# Patient Record
Sex: Female | Born: 1945 | Race: Black or African American | Hispanic: No | Smoking: Never smoker
Health system: Southern US, Community
[De-identification: ages and names within clinical notes are randomized; demographics above are authoritative.]

## PROBLEM LIST (undated history)

## (undated) DIAGNOSIS — E785 Hyperlipidemia, unspecified: Secondary | ICD-10-CM

## (undated) DIAGNOSIS — C73 Malignant neoplasm of thyroid gland: Secondary | ICD-10-CM

## (undated) DIAGNOSIS — I10 Essential (primary) hypertension: Secondary | ICD-10-CM

## (undated) DIAGNOSIS — M1990 Unspecified osteoarthritis, unspecified site: Secondary | ICD-10-CM

## (undated) HISTORY — DX: Essential (primary) hypertension: I10

## (undated) HISTORY — DX: Unspecified osteoarthritis, unspecified site: M19.90

## (undated) HISTORY — DX: Malignant neoplasm of thyroid gland: C73

## (undated) HISTORY — PX: TUBAL LIGATION: SHX77

## (undated) HISTORY — DX: Hyperlipidemia, unspecified: E78.5

---

## 2002-07-24 ENCOUNTER — Encounter: Payer: Self-pay | Admitting: Internal Medicine

## 2005-07-16 LAB — CONVERTED CEMR LAB: Pap Smear: NORMAL

## 2005-11-12 ENCOUNTER — Encounter: Admission: RE | Admit: 2005-11-12 | Discharge: 2005-11-12 | Payer: Self-pay | Admitting: Family Medicine

## 2006-03-25 ENCOUNTER — Ambulatory Visit: Payer: Self-pay | Admitting: Family Medicine

## 2007-03-25 ENCOUNTER — Telehealth (INDEPENDENT_AMBULATORY_CARE_PROVIDER_SITE_OTHER): Payer: Self-pay | Admitting: *Deleted

## 2007-03-28 ENCOUNTER — Ambulatory Visit: Payer: Self-pay | Admitting: Family Medicine

## 2007-03-28 ENCOUNTER — Telehealth (INDEPENDENT_AMBULATORY_CARE_PROVIDER_SITE_OTHER): Payer: Self-pay | Admitting: *Deleted

## 2007-03-28 DIAGNOSIS — I10 Essential (primary) hypertension: Secondary | ICD-10-CM

## 2007-03-28 DIAGNOSIS — J309 Allergic rhinitis, unspecified: Secondary | ICD-10-CM | POA: Insufficient documentation

## 2007-03-28 LAB — CONVERTED CEMR LAB
BUN: 13 mg/dL (ref 6–23)
CO2: 33 meq/L — ABNORMAL HIGH (ref 19–32)
Calcium: 9.2 mg/dL (ref 8.4–10.5)
Cholesterol: 256 mg/dL (ref 0–200)
GFR calc Af Amer: 82 mL/min
Glucose, Urine, Semiquant: NEGATIVE
HDL: 77.5 mg/dL (ref >39.0)
Ketones, urine, test strip: NEGATIVE
Nitrite: NEGATIVE
Potassium: 2.9 meq/L — ABNORMAL LOW (ref 3.5–5.1)
Sodium: 144 meq/L (ref 135–145)
VLDL: 9 mg/dL (ref 0–40)
pH: 6.5

## 2007-04-04 ENCOUNTER — Ambulatory Visit: Payer: Self-pay | Admitting: Family Medicine

## 2007-04-07 ENCOUNTER — Telehealth (INDEPENDENT_AMBULATORY_CARE_PROVIDER_SITE_OTHER): Payer: Self-pay | Admitting: *Deleted

## 2007-04-18 ENCOUNTER — Ambulatory Visit: Payer: Self-pay | Admitting: Family Medicine

## 2007-04-24 ENCOUNTER — Telehealth (INDEPENDENT_AMBULATORY_CARE_PROVIDER_SITE_OTHER): Payer: Self-pay | Admitting: *Deleted

## 2007-04-24 ENCOUNTER — Encounter (INDEPENDENT_AMBULATORY_CARE_PROVIDER_SITE_OTHER): Payer: Self-pay | Admitting: *Deleted

## 2007-04-24 LAB — CONVERTED CEMR LAB: Potassium: 3.6 meq/L (ref 3.5–5.1)

## 2007-07-21 ENCOUNTER — Telehealth (INDEPENDENT_AMBULATORY_CARE_PROVIDER_SITE_OTHER): Payer: Self-pay | Admitting: *Deleted

## 2007-08-01 ENCOUNTER — Telehealth (INDEPENDENT_AMBULATORY_CARE_PROVIDER_SITE_OTHER): Payer: Self-pay | Admitting: *Deleted

## 2007-11-13 ENCOUNTER — Telehealth (INDEPENDENT_AMBULATORY_CARE_PROVIDER_SITE_OTHER): Payer: Self-pay | Admitting: *Deleted

## 2007-11-17 ENCOUNTER — Telehealth (INDEPENDENT_AMBULATORY_CARE_PROVIDER_SITE_OTHER): Payer: Self-pay | Admitting: *Deleted

## 2007-12-02 ENCOUNTER — Ambulatory Visit: Payer: Self-pay | Admitting: Internal Medicine

## 2007-12-15 ENCOUNTER — Telehealth (INDEPENDENT_AMBULATORY_CARE_PROVIDER_SITE_OTHER): Payer: Self-pay | Admitting: *Deleted

## 2008-02-18 ENCOUNTER — Encounter: Payer: Self-pay | Admitting: Family Medicine

## 2008-09-15 ENCOUNTER — Ambulatory Visit: Payer: Self-pay | Admitting: Internal Medicine

## 2008-09-20 LAB — CONVERTED CEMR LAB
BUN: 16 mg/dL (ref 6–23)
Basophils Absolute: 0.2 10*3/uL — ABNORMAL HIGH (ref 0.0–0.1)
Basophils Relative: 2.7 % (ref 0.0–3.0)
Chloride: 102 meq/L (ref 96–112)
GFR calc Af Amer: 82 mL/min
GFR calc non Af Amer: 68 mL/min
HCT: 41 % (ref 36.0–46.0)
Platelets: 305 10*3/uL (ref 150–400)
RBC: 4.47 M/uL (ref 3.87–5.11)
Sodium: 139 meq/L (ref 135–145)

## 2009-04-21 ENCOUNTER — Telehealth (INDEPENDENT_AMBULATORY_CARE_PROVIDER_SITE_OTHER): Payer: Self-pay | Admitting: *Deleted

## 2009-05-02 ENCOUNTER — Ambulatory Visit: Payer: Self-pay | Admitting: Internal Medicine

## 2009-05-02 DIAGNOSIS — C73 Malignant neoplasm of thyroid gland: Secondary | ICD-10-CM | POA: Insufficient documentation

## 2009-05-03 ENCOUNTER — Encounter (INDEPENDENT_AMBULATORY_CARE_PROVIDER_SITE_OTHER): Payer: Self-pay | Admitting: *Deleted

## 2009-05-05 ENCOUNTER — Encounter: Admission: RE | Admit: 2009-05-05 | Discharge: 2009-05-05 | Payer: Self-pay | Admitting: Internal Medicine

## 2009-05-20 ENCOUNTER — Ambulatory Visit: Payer: Self-pay | Admitting: Internal Medicine

## 2009-05-24 LAB — CONVERTED CEMR LAB
AST: 19 units/L (ref 0–37)
BUN: 13 mg/dL (ref 6–23)
Basophils Absolute: 0 10*3/uL (ref 0.0–0.1)
Calcium: 9.5 mg/dL (ref 8.4–10.5)
HCT: 40.4 % (ref 36.0–46.0)
MCHC: 34.3 g/dL (ref 30.0–36.0)
MCV: 95.2 fL (ref 78.0–100.0)
Monocytes Relative: 6.9 % (ref 3.0–12.0)
Neutro Abs: 4.3 10*3/uL (ref 1.4–7.7)
RBC: 4.25 M/uL (ref 3.87–5.11)
Sodium: 145 meq/L (ref 135–145)
Total CHOL/HDL Ratio: 3
Triglycerides: 59 mg/dL (ref 0.0–149.0)
WBC: 7.2 10*3/uL (ref 4.5–10.5)

## 2009-06-02 ENCOUNTER — Other Ambulatory Visit: Admission: RE | Admit: 2009-06-02 | Discharge: 2009-06-02 | Payer: Self-pay | Admitting: Interventional Radiology

## 2009-06-02 ENCOUNTER — Encounter: Payer: Self-pay | Admitting: Internal Medicine

## 2009-06-02 ENCOUNTER — Encounter: Admission: RE | Admit: 2009-06-02 | Discharge: 2009-06-02 | Payer: Self-pay | Admitting: Internal Medicine

## 2009-06-17 ENCOUNTER — Ambulatory Visit: Payer: Self-pay | Admitting: Internal Medicine

## 2009-07-01 ENCOUNTER — Encounter (INDEPENDENT_AMBULATORY_CARE_PROVIDER_SITE_OTHER): Payer: Self-pay | Admitting: *Deleted

## 2009-07-16 DIAGNOSIS — C73 Malignant neoplasm of thyroid gland: Secondary | ICD-10-CM

## 2009-07-16 HISTORY — PX: THYROIDECTOMY: SHX17

## 2009-07-16 HISTORY — DX: Malignant neoplasm of thyroid gland: C73

## 2009-07-19 ENCOUNTER — Encounter: Payer: Self-pay | Admitting: Internal Medicine

## 2009-07-22 ENCOUNTER — Ambulatory Visit: Payer: Self-pay | Admitting: Internal Medicine

## 2009-07-25 ENCOUNTER — Encounter (INDEPENDENT_AMBULATORY_CARE_PROVIDER_SITE_OTHER): Payer: Self-pay | Admitting: *Deleted

## 2009-07-25 LAB — CONVERTED CEMR LAB: Fecal Occult Bld: NEGATIVE

## 2009-08-08 ENCOUNTER — Ambulatory Visit (HOSPITAL_COMMUNITY): Admission: RE | Admit: 2009-08-08 | Discharge: 2009-08-08 | Payer: Self-pay | Admitting: Surgery

## 2009-08-08 ENCOUNTER — Encounter (INDEPENDENT_AMBULATORY_CARE_PROVIDER_SITE_OTHER): Payer: Self-pay | Admitting: Surgery

## 2009-08-22 ENCOUNTER — Encounter: Payer: Self-pay | Admitting: Internal Medicine

## 2009-10-13 ENCOUNTER — Encounter: Payer: Self-pay | Admitting: Internal Medicine

## 2009-12-23 ENCOUNTER — Ambulatory Visit: Payer: Self-pay | Admitting: Internal Medicine

## 2009-12-23 DIAGNOSIS — D239 Other benign neoplasm of skin, unspecified: Secondary | ICD-10-CM | POA: Insufficient documentation

## 2009-12-23 DIAGNOSIS — M79609 Pain in unspecified limb: Secondary | ICD-10-CM | POA: Insufficient documentation

## 2010-01-04 ENCOUNTER — Encounter: Payer: Self-pay | Admitting: Internal Medicine

## 2010-08-15 NOTE — Consult Note (Signed)
Summary: trigger finger, Rx local injection  [facility]   Imported By: Lanelle Bal 01/18/2010 13:51:33  _____________________________________________________________________  External Attachment:    Type:   Image     Comment:   External Document

## 2010-08-15 NOTE — Letter (Signed)
Summary: thyroid cancer, needs total thyroidectomy  [facility]   Imported By: Lanelle Bal 09/07/2009 12:01:33  _____________________________________________________________________  External Attachment:    Type:   Image     Comment:   External Document

## 2010-08-15 NOTE — Consult Note (Signed)
Summary: planning thyroid surgery----Surgery  [facility]   Imported By: Lanelle Bal 09/01/2009 12:30:32  _____________________________________________________________________  External Attachment:    Type:   Image     Comment:   External Document

## 2010-08-15 NOTE — Assessment & Plan Note (Signed)
Summary: bp check/cbs   Vital Signs:  Patient profile:   65 year old female Height:      61.5 inches Weight:      160 pounds BMI:     29.85 Pulse rate:   64 / minute BP sitting:   134 / 90  (left arm)  Vitals Entered By: Doristine Devoid (December 23, 2009 3:23 PM) CC: bp f/u and trigger finger L thumb painful x2 weeks    History of Present Illness: ROV HTN-- good medication compliance, no ambulatory BPs  s/p thyroid surgery, PMH updated , she had a small area of cancer, plan is to f/u clinically  Also complaining of trigger finger on the left thumb  Allergies: No Known Drug Allergies  Past History:  Past Medical History: Allergic rhinitis Hypertension thyroid surgery, see PSH  Past Surgical History: C section x 1  Tubal ligation colposcopy R thyroidectomy 1-11: nodule was ok but 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 Resp:  Denies cough and shortness of breath. GI:  Denies diarrhea, nausea, and vomiting.  Physical Exam  General:  alert and well-developed.   Lungs:  normal respiratory effort, no intercostal retractions, no accessory muscle use, and normal breath sounds.   Heart:  normal rate, regular rhythm, and no murmur.   Extremities:  left hand: thumb  with decreased range of motion, mild swelling , PIP with decreased mobility.   Skin:  Incidentally, I noticed a 2 mm dark mole at the base of the left thumb. Patient was unaware of the mole   Impression & Recommendations:  Problem # 1:  GOITER (ICD-240.9) status post surgery, a small foci of cancer was found. Plan is to followup periodically by surgery  Problem # 2:  HYPERTENSION (ICD-401.9) BP slightly elevated today but  usually ok. See instructions Her updated medication list for this problem includes:    Triamterene-hctz 37.5-25 Mg Tabs (Triamterene-hctz) .Marland Kitchen... Take one tab by mouth daily  BP  today: 134/90 Prior BP: 120/82 (05/02/2009)  Labs Reviewed: K+: 3.8 (05/20/2009) Creat: : 0.9 (05/20/2009)   Chol: 264 (05/20/2009)   HDL: 78.40 (05/20/2009)   LDL: DEL (03/28/2007)   TG: 59.0 (05/20/2009)  Problem # 3:  FINGER PAIN (ICD-729.5)  needs to see orthopedic surgery Probably a combination of OA  and overuse syndrome since  the patient uses  her hands a lot. Will refer to Dr. Magnus Ivan  Orders: Orthopedic Referral (Ortho)  Problem # 4:  MOLE (ICD-216.9)  incidental finding of a mole , looks suspicious given the dark color and is definitely new. Referred to dermatology  Orders: Dermatology Referral (Derma)  Complete Medication List: 1)  Fexofenadine Hcl 180 Mg Tabs (Fexofenadine hcl) .... Take one tablet daily 2)  Triamterene-hctz 37.5-25 Mg Tabs (Triamterene-hctz) .... Take one tab by mouth daily 3)  Klor-con M20 20 Meq Tbcr (Potassium chloride crys cr) .... Take one tablet daily  Patient Instructions: 1)  Check your blood pressure 2 or 3 times a week. If it is more than 140/85 consistently,please let us know 2)    3)  Please schedule a follow-up appointment in 6 months  (physical) Prescriptions: KLOR-CON M20 20 MEQ  TBCR (POTASSIUM CHLORIDE CRYS CR) Take one tablet daily  #90 x 3   Entered and Authorized by:   Elita Quick E. Dartanion Teo MD   Signed by:   Nolon Rod. Teffany Blaszczyk MD on 12/23/2009  Method used:   [facility]        [facility]  [address] [id number]       [address]       [place], [place]  [address]       Ph: [phone] or [phone]       Fax: [phone]   RxID:   [id number] TRIAMTERENE-HCTZ 37.5-25 MG  TABS (TRIAMTERENE-HCTZ) Take one tab by mouth daily  #90 x 3   Entered and Authorized by:   Nolon Rod. Maitlyn Penza MD   Signed by:   Nolon Rod. Kellye Mizner MD on 12/23/2009   Method used:   [facility]        [facility]  [address] [id number]       [address]       [place], [place]  [address]       Ph: [phone] or  [phone]       Fax: [phone]   RxID:   [id number]

## 2010-08-15 NOTE — Letter (Signed)
Summary: Results Follow up Letter  [facility]  [address] [place], [place] [address]   Phone: [phone]  Fax: [phone]    07/25/2009 MRN: [id number]  [facility] [address] [place], [place]  [address]    Ms. Lavin,    Your stool test was negative for blood.     Please call me if you have any questions or concerns. Alena Bills [phone] ext [phone]

## 2010-08-15 NOTE — Letter (Signed)
Summary: [facility]   Imported By: Lanelle Bal 11/02/2009 10:22:11  _____________________________________________________________________  External Attachment:    Type:   Image     Comment:   External Document

## 2010-10-02 LAB — COMPREHENSIVE METABOLIC PANEL
Calcium: 9.1 mg/dL (ref 8.4–10.5)
Chloride: 101 mEq/L (ref 96–112)
Creatinine, Ser: 1.15 mg/dL (ref 0.4–1.2)
GFR calc Af Amer: 58 mL/min — ABNORMAL LOW (ref >60)
Potassium: 3.6 mEq/L (ref 3.5–5.1)
Sodium: 138 mEq/L (ref 135–145)
Total Bilirubin: 0.4 mg/dL (ref 0.3–1.2)

## 2010-12-24 ENCOUNTER — Other Ambulatory Visit: Payer: Self-pay | Admitting: Internal Medicine

## 2010-12-28 ENCOUNTER — Encounter: Payer: Self-pay | Admitting: Internal Medicine

## 2010-12-29 ENCOUNTER — Encounter: Payer: Self-pay | Admitting: Internal Medicine

## 2010-12-29 ENCOUNTER — Ambulatory Visit (INDEPENDENT_AMBULATORY_CARE_PROVIDER_SITE_OTHER): Payer: PRIVATE HEALTH INSURANCE | Admitting: Internal Medicine

## 2010-12-29 DIAGNOSIS — I10 Essential (primary) hypertension: Secondary | ICD-10-CM

## 2010-12-29 DIAGNOSIS — E049 Nontoxic goiter, unspecified: Secondary | ICD-10-CM

## 2010-12-29 DIAGNOSIS — Z0000 Encounter for general adult medical examination without abnormal findings: Secondary | ICD-10-CM | POA: Insufficient documentation

## 2010-12-29 DIAGNOSIS — D239 Other benign neoplasm of skin, unspecified: Secondary | ICD-10-CM

## 2010-12-29 MED ORDER — TRIAMTERENE-HCTZ 37.5-25 MG PO TABS: 1.0000 | ORAL_TABLET | Freq: Every day | ORAL | Status: DC

## 2010-12-29 NOTE — Assessment & Plan Note (Addendum)
Status post right thyroidectomy, she was recommended to go back to visit her surgeon but she did not follow up. Recommend to make an appointment with her surgeon

## 2010-12-29 NOTE — Assessment & Plan Note (Addendum)
Td 2004 zostavax discussed  EKG negative never had a cscope , states she will never do one  explained the benefits of a colonoscopy iFOB neg 07-2009----provided another kit today  h/o colposcopy for an abnormal PAP, repeated and last Pap Smear 2007--->  normal  not seen by gyn x ~ 4 years ([place]) Declined  referal to gyn, explained need to f/u particularly due to h/o abnormal PAP (cancer) rec dexa --- declined , explained benefits  Last MMG years ago  "I will not do it again" (d/t pain); benefits discussed (early detection) will call if changes her mind  Diet-exercise discussed ; labs

## 2010-12-29 NOTE — Assessment & Plan Note (Addendum)
L thumb mole , referred to derm 12-2009: did not go. Lesion  stable, offered a derm referral otherwise observation.

## 2010-12-29 NOTE — Assessment & Plan Note (Signed)
BP well controlled.

## 2010-12-29 NOTE — Progress Notes (Signed)
  Subjective:    Patient ID: Virginia Cox, female    DOB: 1945/09/21, 65 y.o.   MRN: [id number]  HPI  CPX  Past Medical History  Diagnosis Date  . Allergic rhinitis   . Hypertension   . 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 Cesarean section     x 1  . Tubal ligation   . Thyroidectomy 1/11       - papillary microcarcinoma     Family History: CAD - F (MI age 71's), bro ([place] age 26) HTN - M DM - no colon Ca - no breast Ca - no stroke - no M- living F - deceased age 5 ? 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  Respiratory: Negative for cough and shortness of breath.   Cardiovascular: Negative for chest pain and leg swelling.  Gastrointestinal: Negative for nausea, abdominal pain, diarrhea and blood in stool.  Genitourinary: Negative for hematuria, vaginal bleeding, vaginal discharge, difficulty urinating and dyspareunia.       SBE neg       Objective:   Physical Exam  Constitutional: She is oriented to person, place, and time. She appears well-developed and well-nourished.  HENT:  Head: Normocephalic and atraumatic.  Neck: Normal range of motion. Neck supple.       Well-healed surgical scar from thyroidectomy, no mass or lymphadenopathy noted at the neck or supraclavicular area  Cardiovascular: Normal rate, regular rhythm and normal heart sounds.   No murmur heard. Pulmonary/Chest: Effort normal and breath sounds normal. No respiratory distress. She has no wheezes. She has no rales.  Abdominal: Soft. She exhibits no distension. There is no tenderness. There is no rebound and no guarding.  Musculoskeletal: She exhibits no edema.  Neurological: She is alert and oriented to person, place, and time.  Skin: Skin is warm and  dry.       Oval 2mm dark mole at the base of the left thumb  Psychiatric: She has a normal mood and affect. Her behavior is normal. Judgment and thought content normal.          Assessment & Plan:

## 2010-12-29 NOTE — Patient Instructions (Signed)
Please come back fasting next week for labs: FLP, TSH, BMP, CBC, AST, ALT, vit D----dx V70

## 2011-01-03 ENCOUNTER — Encounter: Payer: Self-pay | Admitting: *Deleted

## 2011-01-04 ENCOUNTER — Other Ambulatory Visit: Payer: Self-pay | Admitting: Internal Medicine

## 2011-01-04 DIAGNOSIS — Z0000 Encounter for general adult medical examination without abnormal findings: Secondary | ICD-10-CM

## 2011-01-05 ENCOUNTER — Other Ambulatory Visit (INDEPENDENT_AMBULATORY_CARE_PROVIDER_SITE_OTHER): Payer: PRIVATE HEALTH INSURANCE

## 2011-01-05 DIAGNOSIS — Z0000 Encounter for general adult medical examination without abnormal findings: Secondary | ICD-10-CM

## 2011-01-05 LAB — CBC WITH DIFFERENTIAL/PLATELET
Eosinophils Relative: 2 % (ref 0–5)
HCT: 40.8 % (ref 36.0–46.0)
Hemoglobin: 13.6 g/dL (ref 12.0–15.0)
Lymphocytes Relative: 34 % (ref 12–46)
Lymphs Abs: 3 10*3/uL (ref 0.7–4.0)
MCV: 91.7 fL (ref 78.0–100.0)
Monocytes Absolute: 0.6 10*3/uL (ref 0.1–1.0)
Monocytes Relative: 7 % (ref 3–12)
Platelets: 359 10*3/uL (ref 150–400)
RBC: 4.45 MIL/uL (ref 3.87–5.11)
WBC: 8.6 10*3/uL (ref 4.0–10.5)

## 2011-01-05 LAB — LIPID PANEL
Cholesterol: 233 mg/dL — ABNORMAL HIGH (ref 0–200)
HDL: 71 mg/dL (ref >39)
Total CHOL/HDL Ratio: 3.3 Ratio
Triglycerides: 84 mg/dL (ref <150)
VLDL: 17 mg/dL (ref 0–40)

## 2011-01-05 LAB — BASIC METABOLIC PANEL
BUN: 10 mg/dL (ref 6–23)
CO2: 28 mEq/L (ref 19–32)
Calcium: 9.9 mg/dL (ref 8.4–10.5)
Chloride: 103 mEq/L (ref 96–112)
Creat: 0.95 mg/dL (ref 0.50–1.10)
Glucose, Bld: 86 mg/dL (ref 70–99)
Potassium: 3.9 mEq/L (ref 3.5–5.3)
Sodium: 142 mEq/L (ref 135–145)

## 2011-01-08 NOTE — Progress Notes (Signed)
Labs only

## 2011-01-09 IMAGING — US US BIOPSY
1 series · 10 of 10 positions shown · non-contrast
Comparison: none

CLINICAL DATA: Right thyroid nodule

ULTRASOUND-GUIDED NEEDLE ASPIRATE BIOPSY, RIGHT LOBE OF THYROID
The above procedure was discussed with the patient and written
informed consent was obtained.

[Series 1: us biopsy · 0.08mm/px · 10 acquisitions, 10 frames shown]
[im 1/10]
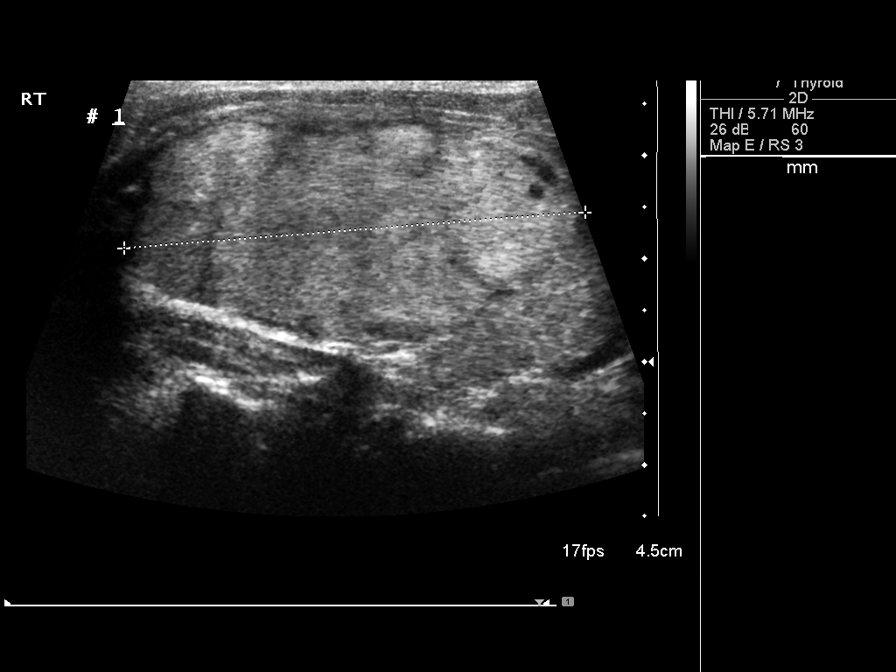
[im 2/10]
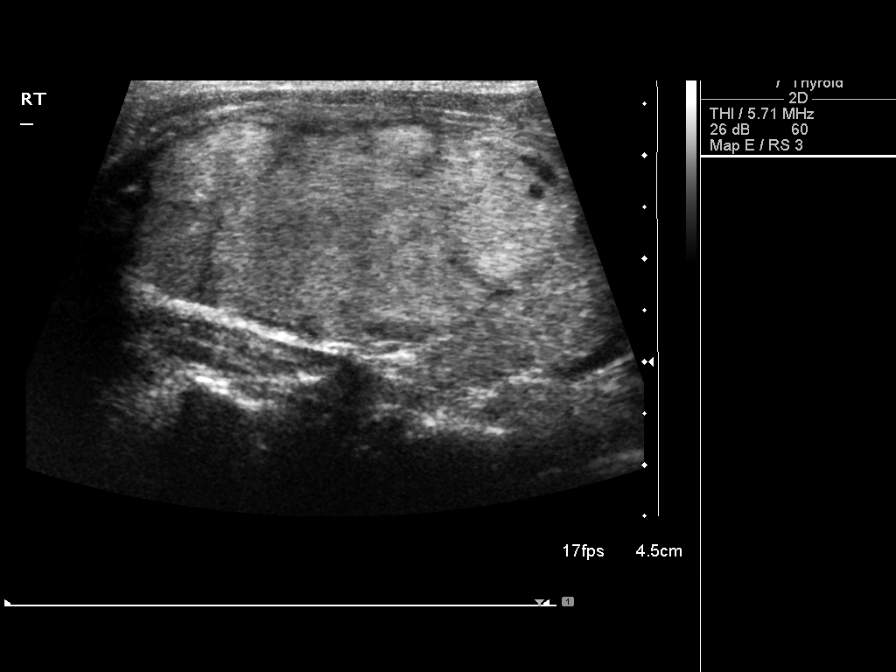
[im 3/10]
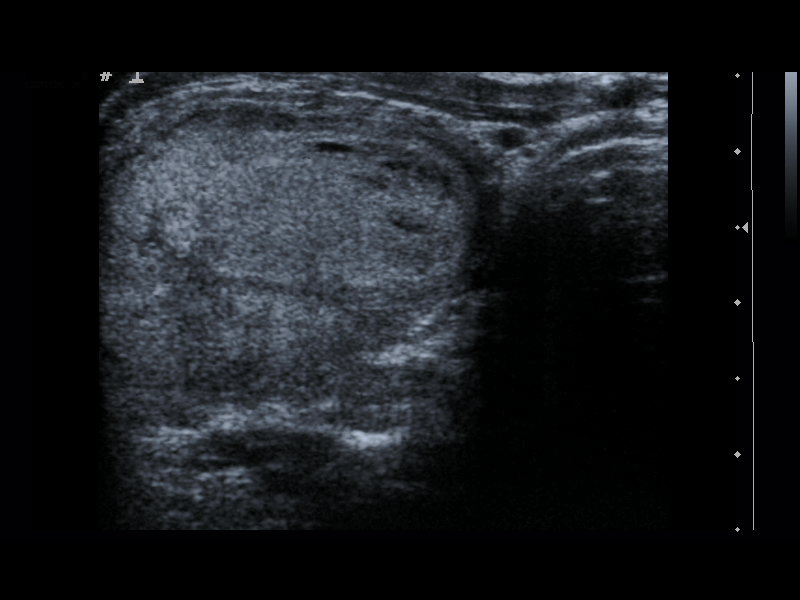
[im 4/10]
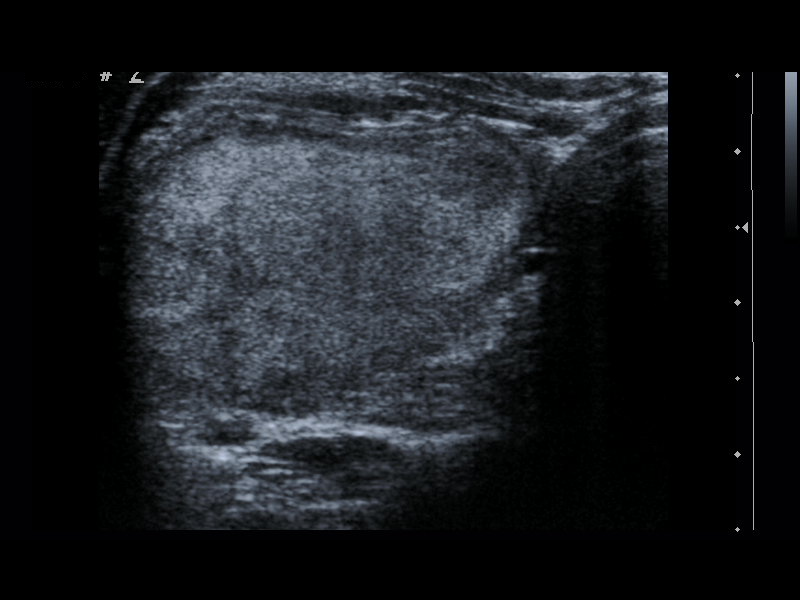
[im 5/10]
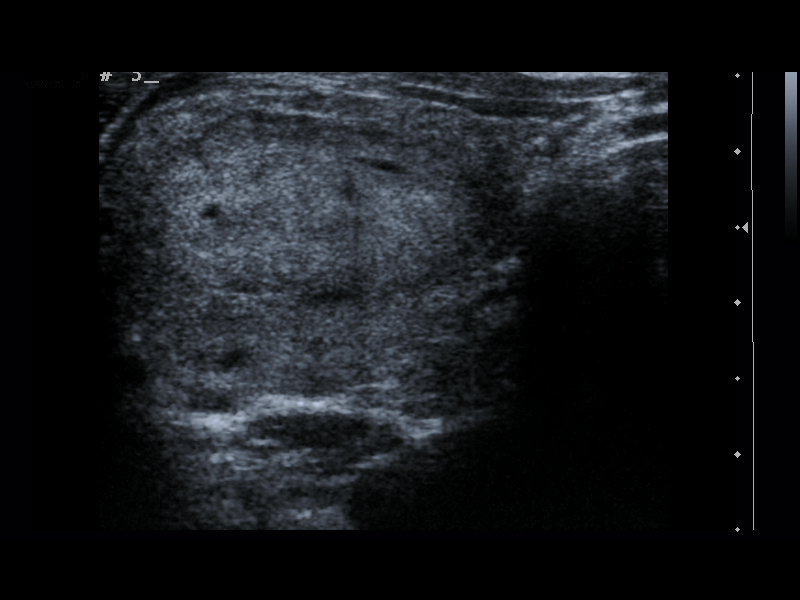
[im 6/10]
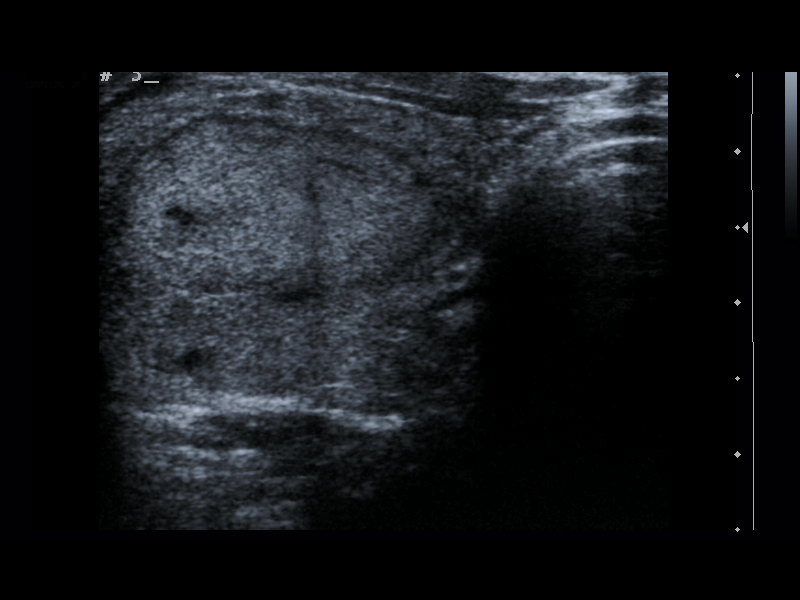
[im 7/10]
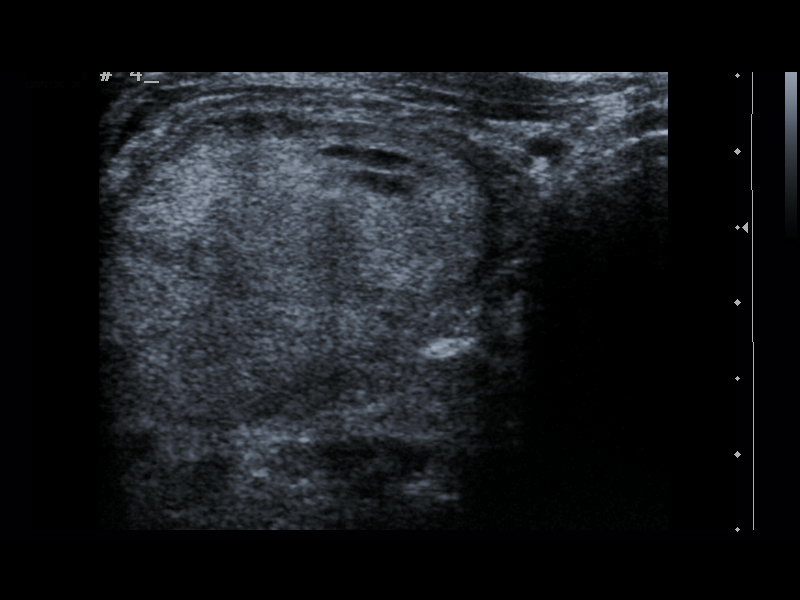
[im 8/10]
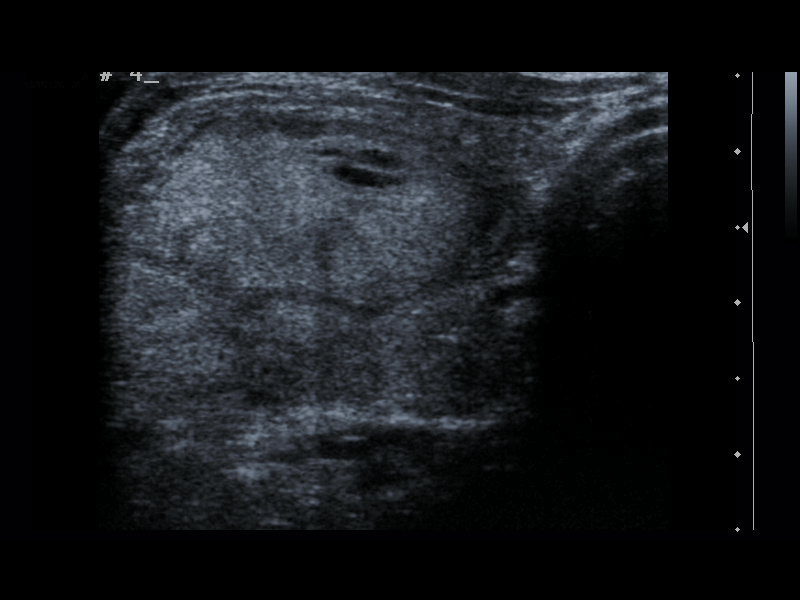
[im 9/10]
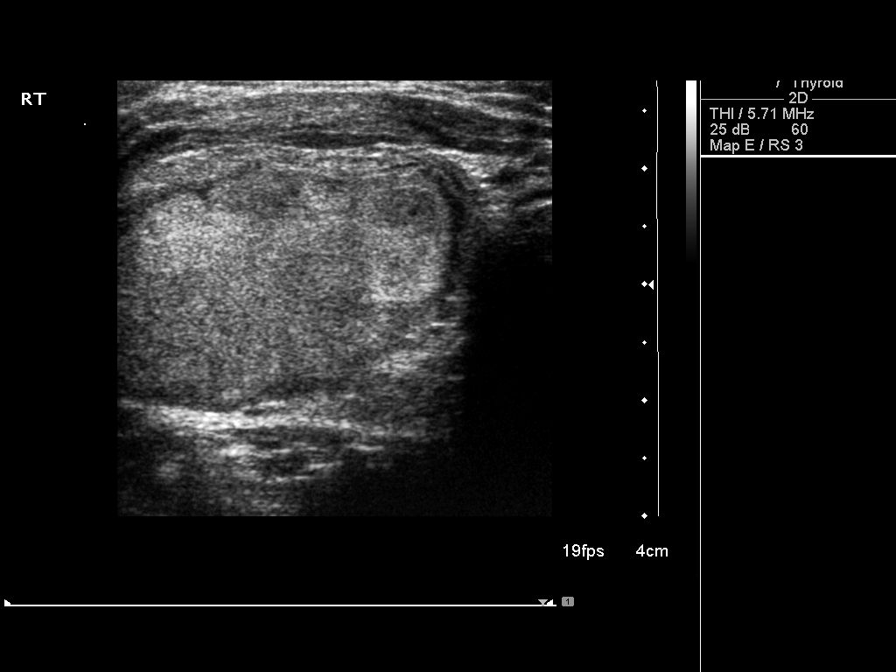
[im 10/10]
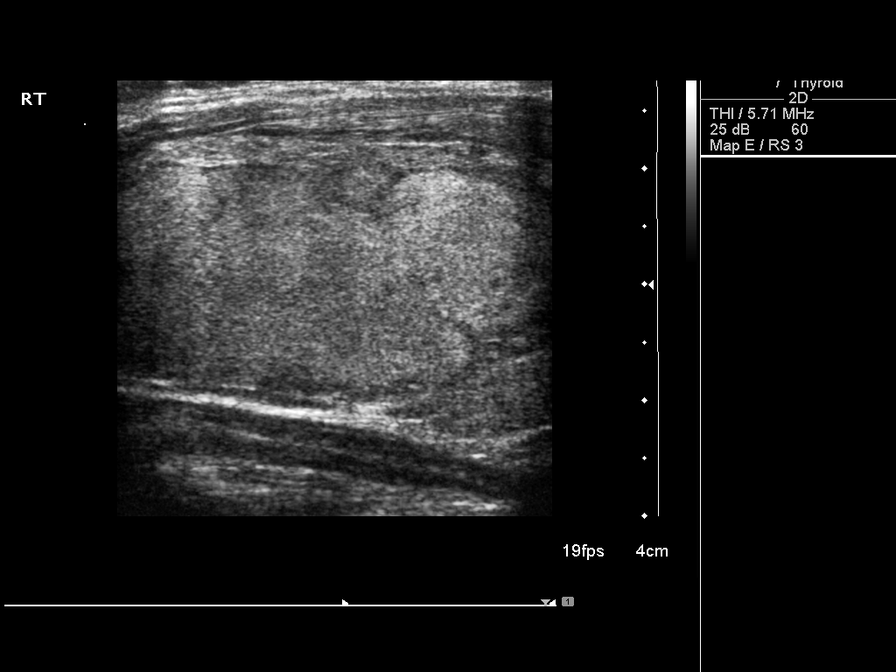

[10 of 10 positions shown; findings below may reference images not displayed]

FINDINGS: Ultrasound was performed to localize and mark an adequate
site for the biopsy.  The patient was then prepped and draped in a
normal sterile fashion.  Local anesthesia was provided with 1%
lidocaine.  Using direct ultrasound guidance, 4 passes were made
using 25g needles into the nodule within the right lobe of the
thyroid.  Ultrasound was used to confirm needle placements on all
occasions.  Specimens were sent to Pathology for analysis.  Post
procedural imaging demonstrated no hematoma or immediate
complication.  The patient tolerated the procedure well.
IMPRESSION: Successful ultrasound guided biopsy of right thyroid
nodule.  Pathology pending.

Read by: Apa, Misanovic.-ELKIN

## 2011-01-10 ENCOUNTER — Other Ambulatory Visit: Payer: Self-pay | Admitting: Internal Medicine

## 2011-01-10 ENCOUNTER — Other Ambulatory Visit: Payer: PRIVATE HEALTH INSURANCE

## 2011-01-10 DIAGNOSIS — Z1211 Encounter for screening for malignant neoplasm of colon: Secondary | ICD-10-CM

## 2011-01-10 LAB — FECAL OCCULT BLOOD, IMMUNOCHEMICAL: Fecal Occult Bld: NEGATIVE

## 2011-01-12 ENCOUNTER — Encounter: Payer: Self-pay | Admitting: *Deleted

## 2011-03-27 ENCOUNTER — Other Ambulatory Visit: Payer: Self-pay | Admitting: Internal Medicine

## 2011-03-28 ENCOUNTER — Other Ambulatory Visit: Payer: Self-pay | Admitting: Internal Medicine

## 2011-06-25 ENCOUNTER — Other Ambulatory Visit: Payer: Self-pay | Admitting: Internal Medicine

## 2011-09-21 ENCOUNTER — Other Ambulatory Visit: Payer: Self-pay | Admitting: Internal Medicine

## 2011-09-21 NOTE — Telephone Encounter (Signed)
Prescription sent to pharmacy.

## 2011-10-08 ENCOUNTER — Other Ambulatory Visit: Payer: Self-pay | Admitting: Internal Medicine

## 2011-10-08 NOTE — Telephone Encounter (Signed)
Refill done.  

## 2011-12-19 ENCOUNTER — Other Ambulatory Visit: Payer: Self-pay | Admitting: Internal Medicine

## 2011-12-19 NOTE — Telephone Encounter (Signed)
Refill done.  

## 2012-01-28 ENCOUNTER — Ambulatory Visit (INDEPENDENT_AMBULATORY_CARE_PROVIDER_SITE_OTHER): Payer: PRIVATE HEALTH INSURANCE | Admitting: Internal Medicine

## 2012-01-28 ENCOUNTER — Encounter: Payer: Self-pay | Admitting: Internal Medicine

## 2012-01-28 VITALS — BP 138/86 | HR 63 | Temp 98.0°F | Ht 62.5 in | Wt 156.0 lb

## 2012-01-28 DIAGNOSIS — M1990 Unspecified osteoarthritis, unspecified site: Secondary | ICD-10-CM | POA: Insufficient documentation

## 2012-01-28 DIAGNOSIS — J309 Allergic rhinitis, unspecified: Secondary | ICD-10-CM

## 2012-01-28 DIAGNOSIS — C73 Malignant neoplasm of thyroid gland: Secondary | ICD-10-CM

## 2012-01-28 DIAGNOSIS — Z0000 Encounter for general adult medical examination without abnormal findings: Secondary | ICD-10-CM

## 2012-01-28 DIAGNOSIS — Z23 Encounter for immunization: Secondary | ICD-10-CM

## 2012-01-28 LAB — COMPREHENSIVE METABOLIC PANEL
ALT: 12 U/L (ref 0–35)
AST: 17 U/L (ref 0–37)
Alkaline Phosphatase: 70 U/L (ref 39–117)
Chloride: 98 mEq/L (ref 96–112)
Creatinine, Ser: 1.1 mg/dL (ref 0.4–1.2)
Total Bilirubin: 0.5 mg/dL (ref 0.3–1.2)

## 2012-01-28 LAB — CBC WITH DIFFERENTIAL/PLATELET
Basophils Absolute: 0 10*3/uL (ref 0.0–0.1)
Basophils Relative: 0.3 % (ref 0.0–3.0)
Eosinophils Absolute: 0.1 10*3/uL (ref 0.0–0.7)
HCT: 44.7 % (ref 36.0–46.0)
Hemoglobin: 14.9 g/dL (ref 12.0–15.0)
Lymphs Abs: 1.8 10*3/uL (ref 0.7–4.0)
MCHC: 33.3 g/dL (ref 30.0–36.0)
MCV: 93.8 fl (ref 78.0–100.0)
Neutro Abs: 6.5 10*3/uL (ref 1.4–7.7)
RDW: 12.9 % (ref 11.5–14.6)

## 2012-01-28 LAB — TSH: TSH: 1.41 u[IU]/mL (ref 0.35–5.50)

## 2012-01-28 LAB — LIPID PANEL: VLDL: 20 mg/dL (ref 0.0–40.0)

## 2012-01-28 LAB — LDL CHOLESTEROL, DIRECT: Direct LDL: 164.6 mg/dL

## 2012-01-28 NOTE — Assessment & Plan Note (Signed)
Has not seen surgery in almost 2 years. Exam today is normal. Refer to surgery

## 2012-01-28 NOTE — Assessment & Plan Note (Signed)
Td 2004 and Tdap 01-2012 zostavax discussed  never had a cscope , explained the benefits of a colonoscopy, declined a referral, provided an iFOB  h/o colposcopy for an abnormal PAP, repeated and last Pap Smear 2007--->  normal  Referr to gyn rec dexa --- declined , explained benefits  Last MMG years ago   in the past she refused to get more mammograms due to pain. Issue discussed again. Diet-exercise discussed ; labs

## 2012-01-28 NOTE — Assessment & Plan Note (Signed)
Complaining of a headache today, exam is benign, trial with Nasonex, see instructions.

## 2012-01-28 NOTE — Patient Instructions (Addendum)
Nasonex 2 sprays on each side of the nose until the sample is gone. Mucinex as needed for congestion Call if the headache is not improving. ------- Continue using ice in your left knee, you may like to consider a knee sleeve as well; 2 Tylenol as needed for pain, Ultram if pain severe. ----------- Check the  blood pressure 2 or 3 times a week, be sure it is between 110/60 and 140/85. If it is consistently higher or lower, let me know

## 2012-01-28 NOTE — Progress Notes (Signed)
  Subjective:    Patient ID: Virginia Cox, female    DOB: [birth year], 66 y.o.   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 [place]  Social History:  divorced , 3 kids , mother lives w/ her . Occupation: Midwife  Never Smoked  Alcohol use-no  Drug use-no  Regular exercise--no much lately  Diet-- healthy   Review of Systems In general feels well. Allergies are usually well controlled . Today she woke up with a headache, left sided and frontal, not the worst of her life, not an unusual for her to have a headache, thinks related to allergies. Complaining of left knee pain on and off, occasionally it gets locked. Has seen a orthopedic doctor, Dr. Magnus Ivan, overall slightly improving. No chest pain or shortness of breath No nausea, vomiting, diarrhea. No dysuria or gross hematuria. No anxiety or depression. Good compliance with BP medicines, no ambulatory BPs.    Objective:   Physical Exam  General -- alert, well-developed, and well-nourished.   Neck --no thyromegaly ,  no LADs HEENT -- TMs normal, throat w/o redness, face symmetric and not tender to palpation,  nose is slightly congested. Lungs -- normal respiratory effort, no intercostal retractions, no accessory muscle use, and normal breath sounds.   Heart-- normal rate, regular rhythm, no murmur, and no gallop.   Abdomen--soft, non-tender, no distention, no masses, no HSM, no guarding, and no rigidity.   Extremities-- no pretibial edema bilaterally; knees symmetric, left knee specifically is full range of motion, some pain with flexion. Neurologic-- alert & oriented X3 and strength  normal in all extremities. Psych-- Cognition and judgment appear intact. Alert and cooperative with normal attention span and concentration.  not anxious appearing and not depressed appearing.      Assessment & Plan:

## 2012-01-28 NOTE — Assessment & Plan Note (Signed)
See instructions

## 2012-02-01 MED ORDER — ATORVASTATIN CALCIUM 10 MG PO TABS: 10.0000 mg | ORAL_TABLET | Freq: Every day | ORAL | Status: DC

## 2012-02-01 NOTE — Addendum Note (Signed)
Addended by: Edwena Felty T on: 02/01/2012 04:06 PM   Modules accepted: Orders

## 2012-02-08 ENCOUNTER — Other Ambulatory Visit: Payer: PRIVATE HEALTH INSURANCE

## 2012-02-08 DIAGNOSIS — Z0000 Encounter for general adult medical examination without abnormal findings: Secondary | ICD-10-CM

## 2012-02-08 LAB — FECAL OCCULT BLOOD, IMMUNOCHEMICAL: Fecal Occult Bld: NEGATIVE

## 2012-02-11 ENCOUNTER — Encounter: Payer: Self-pay | Admitting: *Deleted

## 2012-02-26 ENCOUNTER — Other Ambulatory Visit (INDEPENDENT_AMBULATORY_CARE_PROVIDER_SITE_OTHER): Payer: Self-pay | Admitting: Surgery

## 2012-02-26 ENCOUNTER — Encounter (INDEPENDENT_AMBULATORY_CARE_PROVIDER_SITE_OTHER): Payer: Self-pay | Admitting: Surgery

## 2012-02-26 ENCOUNTER — Ambulatory Visit (INDEPENDENT_AMBULATORY_CARE_PROVIDER_SITE_OTHER): Payer: PRIVATE HEALTH INSURANCE | Admitting: Surgery

## 2012-02-26 VITALS — BP 121/84 | HR 68 | Temp 98.2°F | Resp 14 | Ht 61.5 in | Wt 157.0 lb

## 2012-02-26 DIAGNOSIS — C73 Malignant neoplasm of thyroid gland: Secondary | ICD-10-CM

## 2012-02-26 DIAGNOSIS — Z85850 Personal history of malignant neoplasm of thyroid: Secondary | ICD-10-CM

## 2012-02-26 NOTE — Progress Notes (Signed)
Subjective:     Patient ID: Virginia Cox, female   DOB: [birth year], 66 y.o.   MRN: [id number]  HPI She is here for a long-term followup. 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 Incidentally, a tiny papillary cancer was found in the gland. After discussion with multiple surgeons, the decision was made to just to close followup. She did not come to her 6 month followup. She was sent back here by primary care physician. She has no problems regarding her neck. She has no difficulty swallowing  Review of Systems     Objective:   Physical Exam On exam, her incision is well-healed. There are no palpable nodules in the neck and no cervical lymphadenopathy    Assessment:     Patient with a history of right thyroid lobectomy for follicular adenoma with incidental finding of papillary carcinoma    Plan:     We will get an ultrasound of the left thyroid lobe to rule out nodules given her previous history of papillary thyroid cancer. I will see her back after the ultrasound was performed.

## 2012-03-03 ENCOUNTER — Ambulatory Visit
Admission: RE | Admit: 2012-03-03 | Discharge: 2012-03-03 | Disposition: A | Discharge status: Home / Self Care | Payer: PRIVATE HEALTH INSURANCE | Source: Ambulatory Visit | Attending: Surgery | Admitting: Surgery

## 2012-03-03 DIAGNOSIS — C73 Malignant neoplasm of thyroid gland: Secondary | ICD-10-CM

## 2012-03-10 ENCOUNTER — Ambulatory Visit (INDEPENDENT_AMBULATORY_CARE_PROVIDER_SITE_OTHER): Payer: PRIVATE HEALTH INSURANCE | Admitting: Surgery

## 2012-03-10 ENCOUNTER — Encounter (INDEPENDENT_AMBULATORY_CARE_PROVIDER_SITE_OTHER): Payer: Self-pay | Admitting: Surgery

## 2012-03-10 VITALS — BP 132/80 | HR 64 | Temp 97.2°F | Ht 62.0 in | Wt 155.8 lb

## 2012-03-10 DIAGNOSIS — Z85850 Personal history of malignant neoplasm of thyroid: Secondary | ICD-10-CM

## 2012-03-10 NOTE — Progress Notes (Signed)
Subjective:     Patient ID: Virginia Cox, female   DOB: Jan 19, 1946, 66 y.o.   MRN: [id number]  HPI  She is here for followup for ultrasound of the thyroid. Again she is otherwise without complaints. Review of Systems     Objective:   Physical Exam Her neck is soft. There are no palpable nodules. The ultrasound showed some residual nodule in the right thyroid bed and minimal nodularity of the left side.  One year followup ultrasound was recommended    Assessment:     History of thyroid cancer    Plan:     I will see her back in one year for followup ultrasound and examination. She will examine herself regularly and she feels a nodule come back and see me sooner

## 2012-03-23 ENCOUNTER — Other Ambulatory Visit: Payer: Self-pay | Admitting: Internal Medicine

## 2012-03-24 NOTE — Telephone Encounter (Signed)
Refill done.  

## 2012-04-20 ENCOUNTER — Other Ambulatory Visit: Payer: Self-pay | Admitting: Internal Medicine

## 2012-04-20 DIAGNOSIS — E785 Hyperlipidemia, unspecified: Secondary | ICD-10-CM

## 2012-04-20 NOTE — Telephone Encounter (Signed)
Was recommended to start Lipitor Due for labs: FLP, AST, ALT -- Dx hyperlipidemia

## 2012-04-21 ENCOUNTER — Encounter: Payer: Self-pay | Admitting: *Deleted

## 2012-04-21 NOTE — Telephone Encounter (Signed)
Refill done. Pt notified she is due for a lab appointment.

## 2012-06-27 ENCOUNTER — Other Ambulatory Visit: Payer: Self-pay | Admitting: Internal Medicine

## 2012-09-04 ENCOUNTER — Other Ambulatory Visit: Payer: Self-pay | Admitting: Internal Medicine

## 2012-09-04 NOTE — Telephone Encounter (Signed)
Refill done.  Pt is due for labs.

## 2012-10-14 ENCOUNTER — Ambulatory Visit (INDEPENDENT_AMBULATORY_CARE_PROVIDER_SITE_OTHER): Payer: PRIVATE HEALTH INSURANCE | Admitting: Internal Medicine

## 2012-10-14 VITALS — BP 118/82 | HR 75 | Temp 99.4°F | Wt 160.0 lb

## 2012-10-14 DIAGNOSIS — E785 Hyperlipidemia, unspecified: Secondary | ICD-10-CM | POA: Insufficient documentation

## 2012-10-14 DIAGNOSIS — B349 Viral infection, unspecified: Secondary | ICD-10-CM

## 2012-10-14 DIAGNOSIS — B9789 Other viral agents as the cause of diseases classified elsewhere: Secondary | ICD-10-CM

## 2012-10-14 MED ORDER — PROMETHAZINE HCL 12.5 MG PO TABS: 12.5000 mg | ORAL_TABLET | ORAL | Status: DC | PRN

## 2012-10-14 MED ORDER — ATORVASTATIN CALCIUM 10 MG PO TABS: 10.0000 mg | ORAL_TABLET | Freq: Every day | ORAL | Status: DC

## 2012-10-14 NOTE — Assessment & Plan Note (Signed)
On lipitor x months, no apparent s/e. Plan: RF, labs

## 2012-10-14 NOTE — Progress Notes (Signed)
  Subjective:    Patient ID: Virginia Cox, female    DOB: [birth year], 67 y.o.   MRN: [id number]  HPI Acute visit Symptoms started 3-4 days ago: Generalized aches, cough with occasional yellow sputum, ear ache, chest "congestion". Her main symptom is however nausea, persistent, no vomiting or diarrhea. Appetite is somewhat decreased. Also, she is taking Lipitor for few months without apparent side effects.  Past Medical History  Diagnosis Date  . Allergic rhinitis   . Hypertension   . 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 Arthritis   . Hyperlipidemia    Past Surgical History  Procedure Laterality Date  . Cesarean section      x 1  . Tubal ligation    . Thyroidectomy  1/11       - papillary microcarcinoma    Review of Systems Denies fevers, had some chills. No headaches or rash.    Objective:   Physical Exam  General -- alert, well-developed, no apparent distress .    HEENT -- TMs normal, throat w/o redness, face symmetric and not tender to palpation, nose with minimal congestion  Lungs -- normal respiratory effort, no intercostal retractions, no accessory muscle use, and normal breath sounds.   Heart-- normal rate, regular rhythm, no murmur, and no gallop.   Abdomen--not distended, slightly increased bowel sounds, not tender to palpation, no mass or rebound.   Extremities-- no pretibial edema bilaterally  Neurologic-- alert & oriented X3 and strength normal in all extremities. Psych-- Cognition and judgment appear intact. Alert and cooperative with normal attention span and concentration.  not anxious appearing and not depressed appearing.      Assessment & Plan:   Viral syndrome, GI > respiratory sx  Plan: see instructions

## 2012-10-14 NOTE — Patient Instructions (Addendum)
Rest Drink plenty of clear fluids Tylenol  500 mg OTC 2 tabs a day every 8 hours as needed for pain For cough, take Mucinex DM twice a day as needed   Call if no better in few days Call anytime if the symptoms are severe  ---- Please come back once you feel better for labs: FLP AST ALT-- dx hyperlipidemia ---- Next visit by July 2014 (physical)

## 2012-10-15 ENCOUNTER — Encounter: Payer: Self-pay | Admitting: Internal Medicine

## 2012-12-03 ENCOUNTER — Other Ambulatory Visit: Payer: Self-pay | Admitting: Internal Medicine

## 2012-12-03 NOTE — Telephone Encounter (Signed)
Refill done . Pt due for labs.

## 2012-12-04 ENCOUNTER — Other Ambulatory Visit: Payer: Self-pay | Admitting: Internal Medicine

## 2012-12-04 NOTE — Telephone Encounter (Signed)
Refill done.  

## 2013-01-07 ENCOUNTER — Encounter (INDEPENDENT_AMBULATORY_CARE_PROVIDER_SITE_OTHER): Payer: Self-pay | Admitting: Surgery

## 2013-01-31 ENCOUNTER — Other Ambulatory Visit: Payer: Self-pay | Admitting: Internal Medicine

## 2013-02-14 ENCOUNTER — Other Ambulatory Visit: Payer: Self-pay | Admitting: Internal Medicine

## 2013-02-16 ENCOUNTER — Other Ambulatory Visit: Payer: Self-pay | Admitting: Internal Medicine

## 2013-03-03 ENCOUNTER — Telehealth: Payer: Self-pay | Admitting: *Deleted

## 2013-03-03 NOTE — Telephone Encounter (Signed)
[facility] atorvastatin 10 mg.  I have called the pharmacy and explained that according to our office protocol the patient needs to make an appointment for labs while taking this medication I can't give more that a 30 day supply for this medication.  I have also attempted to contact patient and explain about her medication and I had to leave a vm.  If patient calls please advise her to make a appointment for her labs.   Ag cma

## 2013-03-09 ENCOUNTER — Other Ambulatory Visit (INDEPENDENT_AMBULATORY_CARE_PROVIDER_SITE_OTHER): Payer: Self-pay | Admitting: Surgery

## 2013-03-09 ENCOUNTER — Encounter (INDEPENDENT_AMBULATORY_CARE_PROVIDER_SITE_OTHER): Payer: Self-pay | Admitting: Surgery

## 2013-03-09 ENCOUNTER — Ambulatory Visit (INDEPENDENT_AMBULATORY_CARE_PROVIDER_SITE_OTHER): Payer: PRIVATE HEALTH INSURANCE | Admitting: Surgery

## 2013-03-09 VITALS — BP 124/68 | HR 60 | Temp 98.0°F | Resp 14 | Ht 62.0 in | Wt 161.2 lb

## 2013-03-09 DIAGNOSIS — Z85850 Personal history of malignant neoplasm of thyroid: Secondary | ICD-10-CM

## 2013-03-09 NOTE — Progress Notes (Signed)
Subjective:     Patient ID: Virginia Cox, female   DOB: 06-30-1946, 67 y.o.   MRN: [id number]  HPI She is here for long-term visit regarding her right thyroid cancer. Again she had a right thyroid lobectomy for a papillary lesion. This was actually benign but she did have an incidental less than 2 mm papillary cancer found. We have been following her with surveillance examinations and ultrasound. Her last ultrasound was a year ago. She has absolutely no complaints. She examines her neck regularly and is found no abnormalities.  Review of Systems     Objective:   Physical Exam On exam, there are no palpable nodules in her neck    Assessment:     Patient with a history of thyroid cancer     Plan:     She will stop hold on ultrasound which I feel is reasonable. She will continue her self examinations and I will see her back in one year

## 2013-03-10 ENCOUNTER — Other Ambulatory Visit: Payer: Self-pay | Admitting: Internal Medicine

## 2013-03-10 NOTE — Telephone Encounter (Signed)
Med filled.  

## 2013-03-23 ENCOUNTER — Other Ambulatory Visit: Payer: Self-pay | Admitting: Internal Medicine

## 2013-03-23 ENCOUNTER — Other Ambulatory Visit (INDEPENDENT_AMBULATORY_CARE_PROVIDER_SITE_OTHER): Payer: PRIVATE HEALTH INSURANCE

## 2013-03-23 DIAGNOSIS — E785 Hyperlipidemia, unspecified: Secondary | ICD-10-CM

## 2013-03-23 LAB — LIPID PANEL
Cholesterol: 184 mg/dL (ref 0–200)
HDL: 81.5 mg/dL (ref >39.00)
Triglycerides: 49 mg/dL (ref 0.0–149.0)
VLDL: 9.8 mg/dL (ref 0.0–40.0)

## 2013-03-25 ENCOUNTER — Other Ambulatory Visit: Payer: Self-pay | Admitting: Internal Medicine

## 2013-04-05 ENCOUNTER — Other Ambulatory Visit: Payer: Self-pay | Admitting: Internal Medicine

## 2013-04-07 ENCOUNTER — Encounter: Payer: Self-pay | Admitting: Internal Medicine

## 2013-04-07 ENCOUNTER — Ambulatory Visit (INDEPENDENT_AMBULATORY_CARE_PROVIDER_SITE_OTHER): Payer: PRIVATE HEALTH INSURANCE | Admitting: Internal Medicine

## 2013-04-07 VITALS — BP 137/77 | HR 93 | Temp 98.0°F | Ht 62.4 in | Wt 161.8 lb

## 2013-04-07 DIAGNOSIS — Z0000 Encounter for general adult medical examination without abnormal findings: Secondary | ICD-10-CM

## 2013-04-07 LAB — CBC WITH DIFFERENTIAL/PLATELET
Eosinophils Absolute: 0.1 10*3/uL (ref 0.0–0.7)
Eosinophils Relative: 1.3 % (ref 0.0–5.0)
HCT: 43.4 % (ref 36.0–46.0)
Lymphs Abs: 2.2 10*3/uL (ref 0.7–4.0)
MCHC: 33.3 g/dL (ref 30.0–36.0)
MCV: 91.8 fl (ref 78.0–100.0)
Monocytes Absolute: 0.6 10*3/uL (ref 0.1–1.0)
Neutrophils Relative %: 63.9 % (ref 43.0–77.0)
Platelets: 329 10*3/uL (ref 150.0–400.0)
WBC: 8.1 10*3/uL (ref 4.5–10.5)

## 2013-04-07 LAB — BASIC METABOLIC PANEL
BUN: 16 mg/dL (ref 6–23)
CO2: 30 mEq/L (ref 19–32)
Chloride: 102 mEq/L (ref 96–112)
Creatinine, Ser: 1 mg/dL (ref 0.4–1.2)
Glucose, Bld: 89 mg/dL (ref 70–99)
Potassium: 3.5 mEq/L (ref 3.5–5.1)

## 2013-04-07 NOTE — Telephone Encounter (Signed)
rx refilled per protocol. DJR  

## 2013-04-07 NOTE — Assessment & Plan Note (Addendum)
Tdap 01-2012 zostavax discussed before, pneumonia and flu shot--->  declined  never had a cscope, again explained the benefits of a colonoscopy, will call when /if ready for a referral  iFOB (-) 2013, provided another today  h/o colposcopy for an abnormal PAP, repeated and last Pap Smear 2007--->  normal  Referred  to gyn last year, did not go, benefits discussed, agreed to see Dr Laury Axon, will arrange  rec dexa --- declined , explained benefits  Last MMG years ago, pt again strongly refused a MMG  Diet-exercise discussed ; labs

## 2013-04-07 NOTE — Patient Instructions (Signed)

## 2013-04-07 NOTE — Progress Notes (Signed)
Subjective:    Patient ID: Virginia Cox, female    DOB: [birth year], 67 y.o.   MRN: [id number]  HPI CPX For the last 2 weeks has been feeling slightly dizzy, mostly when she turns in bed or close her eyes ("spinning"). Denies any headaches, slurred speech, facial numbness or recent falls. Also has noted some ear discomfort in the R>L  No ear discharge, no runny nose or sneezing. Good compliance with meds, not ambulatory BPs  Past Medical History  Diagnosis Date  . Allergic rhinitis   . Hypertension   . 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 Arthritis   . Hyperlipidemia    Past Surgical History  Procedure Laterality Date  . Cesarean section      x 1  . Tubal ligation    . Thyroidectomy  1/11       - papillary microcarcinoma   History   Social History  . Marital Status: Divorced    Spouse Name: N/A    Number of Children: 3  . Years of Education: N/A   Occupational History  . Inspector     Social History Main Topics  . Smoking status: Never Smoker   . Smokeless tobacco: Never Used  . Alcohol Use: No  . Drug Use: No  . Sexual Activity: Not on file   Other Topics Concern  . Not on file   Social History Narrative   Regular exercise- no      CAD - F (MI age 60's), bro (MI age 98)     HTN - M     DM - no     colon Ca - no     breast Ca - no     stroke - no     M- living     F - deceased age 30 ? [place]              divorced , 3 kids , mother lives w/ her . Occupation: Midwife         Family History  Problem Relation Age of Onset  . Coronary artery disease Father     MI 54s  . Coronary artery disease Brother     MI 52  . Hypertension Mother   . Diabetes Neg Hx   . Colon cancer Neg Hx   . Breast cancer Neg Hx   . Stroke Neg Hx      Review of Systems Diet-- try to eat healthy Exercise-- occ walks , very busy at work No  CP, SOB, lower extremity edema No nausea, vomiting diarrhea No blood in the stools No cough, sputum  production  No dysuria, gross hematuria, difficulty urinating   No anxiety, depression      Objective:   Physical Exam BP 137/77  Pulse 93  Temp(Src) 98 F (36.7 C)  Ht 5' 2.4" (1.585 m)  Wt 161 lb 12.8 oz (73.392 kg)  BMI 29.21 kg/m2  SpO2 99%  General -- alert, well-developed, NAD.  Neck --  carotid pulse wnl HEENT-- Not pale. TMs normal, throat symmetric, no redness or discharge. Face symmetric, sinuses not tender to palpation. Nose not congested. Lungs -- normal respiratory effort, no intercostal retractions, no accessory muscle use, and normal breath sounds.  Heart-- normal rate, regular rhythm, no murmur.  Abdomen-- Not distended, good bowel sounds,soft, non-tender. No rebound or rigidity. Extremities-- no pretibial edema bilaterally  Neurologic--  alert & oriented X3.  Speech normal, gait normal, strength normal  in all extremities.  EOMI, PERLA; roemberg absent  Psych-- Cognition and judgment appear intact. Cooperative with normal attention span and concentration. No anxious appearing , no depressed appearing.      Assessment & Plan:  Dizziness--normal neurological exam, recommend observation. Hypertension-- no ambulatory BPs but BP today normal, no change

## 2013-04-08 ENCOUNTER — Encounter: Payer: Self-pay | Admitting: Internal Medicine

## 2013-04-09 ENCOUNTER — Encounter: Payer: Self-pay | Admitting: General Practice

## 2013-04-14 ENCOUNTER — Encounter (INDEPENDENT_AMBULATORY_CARE_PROVIDER_SITE_OTHER): Payer: Self-pay

## 2013-04-23 ENCOUNTER — Other Ambulatory Visit (INDEPENDENT_AMBULATORY_CARE_PROVIDER_SITE_OTHER): Payer: PRIVATE HEALTH INSURANCE

## 2013-04-23 DIAGNOSIS — Z0000 Encounter for general adult medical examination without abnormal findings: Secondary | ICD-10-CM

## 2013-04-23 LAB — FECAL OCCULT BLOOD, IMMUNOCHEMICAL: Fecal Occult Bld: NEGATIVE

## 2013-04-23 NOTE — Progress Notes (Signed)
Spoke with patient regarding her test results.

## 2013-06-19 ENCOUNTER — Other Ambulatory Visit: Payer: Self-pay | Admitting: Internal Medicine

## 2013-06-19 NOTE — Telephone Encounter (Signed)
Potassium chloride refilled per protocol.  

## 2013-09-13 ENCOUNTER — Other Ambulatory Visit: Payer: Self-pay | Admitting: Internal Medicine

## 2013-12-01 ENCOUNTER — Other Ambulatory Visit: Payer: Self-pay | Admitting: Internal Medicine

## 2013-12-12 ENCOUNTER — Other Ambulatory Visit: Payer: Self-pay | Admitting: Internal Medicine

## 2013-12-13 ENCOUNTER — Other Ambulatory Visit: Payer: Self-pay | Admitting: Internal Medicine

## 2013-12-14 ENCOUNTER — Other Ambulatory Visit: Payer: Self-pay | Admitting: Internal Medicine

## 2014-03-03 ENCOUNTER — Encounter: Payer: Self-pay | Admitting: Internal Medicine

## 2014-03-03 ENCOUNTER — Ambulatory Visit (INDEPENDENT_AMBULATORY_CARE_PROVIDER_SITE_OTHER): Payer: Managed Care, Other (non HMO) | Admitting: Internal Medicine

## 2014-03-03 VITALS — BP 127/87 | HR 58 | Temp 98.4°F | Ht 61.0 in | Wt 164.1 lb

## 2014-03-03 DIAGNOSIS — C73 Malignant neoplasm of thyroid gland: Secondary | ICD-10-CM

## 2014-03-03 DIAGNOSIS — I10 Essential (primary) hypertension: Secondary | ICD-10-CM

## 2014-03-03 DIAGNOSIS — Z0000 Encounter for general adult medical examination without abnormal findings: Secondary | ICD-10-CM

## 2014-03-03 NOTE — Patient Instructions (Signed)
Please come back fasting for labs: CBC, CMP, FLP,TSH -- dx v70    Next visit is for a physical exam in 1 year, fasting Please make an appointment

## 2014-03-03 NOTE — Assessment & Plan Note (Signed)
Tdap 01-2012 zostavax discussed before, pneumonia and flu shot--->  declined  Declined a colonoscopy again   iFOB (-) 2013 , declined to get another iFOB, benefits of early detection detection discussed  h/o colposcopy for an abnormal PAP, repeated and last Pap Smear 2007--->  normal  Has not seen gyn in a while---------referral entered  Declined MMG  rec dexa --- declined , explained benefits Diet-exercise discussed

## 2014-03-03 NOTE — Progress Notes (Signed)
Pre-visit discussion using our clinic review tool. No additional management support is needed unless otherwise documented below in the visit note.  

## 2014-03-03 NOTE — Progress Notes (Signed)
Subjective:    Patient ID: Virginia Cox, female    DOB: August 17, 1945, 68 y.o.   MRN: [id number]  DOS:  03/03/2014 Type of visit - description: CPX History: In general feeling well, no complaints   ROS Diet-- trying to eat healthy Exercise-- occ takes a walk No  CP, SOB Denies  nausea, vomiting diarrhea, blood in the stools (-) cough, sputum production (-) wheezing, chest congestion No recent airplane trips or prolonged car trips No dysuria, gross hematuria, difficulty urinating  No anxiety-depression No vaginal discharge-bleed, genital rash       Past Medical History  Diagnosis Date  . Allergic rhinitis   . Hypertension   . 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 Arthritis   . Hyperlipidemia     Past Surgical History  Procedure Laterality Date  . Cesarean section      x 1  . Tubal ligation    . Thyroidectomy  1/11       - papillary microcarcinoma    History   Social History  . Marital Status: Divorced    Spouse Name: N/A    Number of Children: 3  . Years of Education: N/A   Occupational History  . Inspector     Social History Main Topics  . Smoking status: Never Smoker   . Smokeless tobacco: Never Used  . Alcohol Use: No  . Drug Use: No  . Sexual Activity: Not on file   Other Topics Concern  . Not on file   Social History Narrative   divorced , 3 kids , mother lives w/ her        Family History  Problem Relation Age of Onset  . Coronary artery disease Father     MI 64s  . Coronary artery disease Brother     MI 54  . Hypertension Mother   . Diabetes Neg Hx   . Colon cancer Neg Hx   . Breast cancer Neg Hx   . Stroke Neg Hx        Medication List       This list is accurate as of: 03/03/14  7:08 PM.  Always use your most recent med list.               ALLEGRA ALLERGY 180 MG tablet  Generic drug:  fexofenadine  Take 180 mg by mouth daily. OTC     atorvastatin 10 MG tablet  Commonly known as:  LIPITOR    TAKE 1 TABLET (10 MG TOTAL) BY MOUTH DAILY.     fluticasone 50 MCG/ACT nasal spray  Commonly known as:  FLONASE  Place 2 sprays into the nose daily.     KLOR-CON M20 20 MEQ tablet  Generic drug:  potassium chloride SA  TAKE 1 TABLET BY MOUTH DAILY     triamterene-hydrochlorothiazide 37.5-25 MG per tablet  Commonly known as:  MAXZIDE-25  TAKE ONE TAB BY MOUTH DAILY           Objective:   Physical Exam BP 127/87  Pulse 58  Temp(Src) 98.4 F (36.9 C) (Oral)  Ht 5\' 1"  (1.549 m)  Wt 164 lb 2 oz (74.447 kg)  BMI 31.03 kg/m2  SpO2 97% General -- alert, well-developed, NAD.  Neck --no mass  HEENT-- Not pale.  Lungs -- normal respiratory effort, no intercostal retractions, no accessory muscle use, and normal breath sounds.  Heart-- normal rate, regular rhythm, no murmur.  Abdomen-- Not distended, good bowel sounds,soft,  non-tender. Extremities-- no pretibial edema bilaterally ; Has a 1 cm mole at the left inner pretibial area, unchanged for years according to the patient Neurologic--  alert & oriented X3. Speech normal, gait appropriate for age, strength symmetric and appropriate for age.  Voice  is somehow tremulous  Psych-- Cognition and judgment appear intact. Cooperative with normal attention span and concentration. No anxious or depressed appearing.        Assessment & Plan:

## 2014-03-03 NOTE — Assessment & Plan Note (Signed)
Seems to be well-controlled, will call for refills prn

## 2014-03-03 NOTE — Assessment & Plan Note (Signed)
Last visit w/ surgery 02-2013, due for another visit, recommend to call the surgeons office and get that set up

## 2014-03-05 ENCOUNTER — Other Ambulatory Visit (INDEPENDENT_AMBULATORY_CARE_PROVIDER_SITE_OTHER): Payer: Managed Care, Other (non HMO)

## 2014-03-05 DIAGNOSIS — Z0000 Encounter for general adult medical examination without abnormal findings: Secondary | ICD-10-CM

## 2014-03-06 LAB — LIPID PANEL
Cholesterol: 172 mg/dL (ref 0–200)
HDL: 77.4 mg/dL (ref >39.00)
LDL Cholesterol: 85 mg/dL (ref 0–99)
NonHDL: 94.6
Total CHOL/HDL Ratio: 2
Triglycerides: 47 mg/dL (ref 0.0–149.0)
VLDL: 9.4 mg/dL (ref 0.0–40.0)

## 2014-03-06 LAB — CBC WITH DIFFERENTIAL/PLATELET
Basophils Absolute: 0 10*3/uL (ref 0.0–0.1)
Basophils Relative: 0.4 % (ref 0.0–3.0)
Eosinophils Absolute: 0.1 10*3/uL (ref 0.0–0.7)
Eosinophils Relative: 2 % (ref 0.0–5.0)
HCT: 40.4 % (ref 36.0–46.0)
Hemoglobin: 13.6 g/dL (ref 12.0–15.0)
Lymphocytes Relative: 31.8 % (ref 12.0–46.0)
Lymphs Abs: 2.1 10*3/uL (ref 0.7–4.0)
MCHC: 33.5 g/dL (ref 30.0–36.0)
MCV: 92.8 fl (ref 78.0–100.0)
Monocytes Absolute: 0.4 10*3/uL (ref 0.1–1.0)
Monocytes Relative: 5.9 % (ref 3.0–12.0)
Neutro Abs: 3.9 10*3/uL (ref 1.4–7.7)
Neutrophils Relative %: 59.9 % (ref 43.0–77.0)
Platelets: 317 10*3/uL (ref 150.0–400.0)
RBC: 4.35 Mil/uL (ref 3.87–5.11)
RDW: 13.9 % (ref 11.5–15.5)
WBC: 6.5 10*3/uL (ref 4.0–10.5)

## 2014-03-06 LAB — COMPREHENSIVE METABOLIC PANEL
ALT: 15 U/L (ref 0–35)
AST: 21 U/L (ref 0–37)
Albumin: 3.7 g/dL (ref 3.5–5.2)
Alkaline Phosphatase: 68 U/L (ref 39–117)
BUN: 15 mg/dL (ref 6–23)
CO2: 27 mEq/L (ref 19–32)
Calcium: 8.8 mg/dL (ref 8.4–10.5)
Chloride: 103 mEq/L (ref 96–112)
Creatinine, Ser: 0.9 mg/dL (ref 0.4–1.2)
GFR: 79.51 mL/min (ref >60.00)
Glucose, Bld: 64 mg/dL — ABNORMAL LOW (ref 70–99)
Potassium: 3.8 mEq/L (ref 3.5–5.1)
Sodium: 139 mEq/L (ref 135–145)
Total Bilirubin: 0.7 mg/dL (ref 0.2–1.2)
Total Protein: 6.8 g/dL (ref 6.0–8.3)

## 2014-03-06 LAB — TSH: TSH: 0.32 u[IU]/mL — ABNORMAL LOW (ref 0.35–4.50)

## 2014-03-08 ENCOUNTER — Telehealth: Payer: Self-pay

## 2014-03-08 DIAGNOSIS — E079 Disorder of thyroid, unspecified: Secondary | ICD-10-CM

## 2014-03-08 NOTE — Telephone Encounter (Signed)
Discussed lab results with patient. Enacted referral orders. Patient agrees with plan.

## 2014-03-08 NOTE — Telephone Encounter (Signed)
Message copied by Reino Bellis on Mon Mar 08, 2014  9:16 AM ------      Message from: Colon Branch      Created: Sun Mar 07, 2014 12:33 PM       Advise patient,      Her cholesterol is under excellent control.      the thyroid test is abnormal, her remaining thyroid gland may be overworking. Please arrange a referral to surgery Dr. Ninfa Linden who operate on her thyroid gland before       Otherwise results are great       ------

## 2014-03-15 ENCOUNTER — Encounter (INDEPENDENT_AMBULATORY_CARE_PROVIDER_SITE_OTHER): Payer: Self-pay | Admitting: Surgery

## 2014-03-15 ENCOUNTER — Ambulatory Visit (INDEPENDENT_AMBULATORY_CARE_PROVIDER_SITE_OTHER): Payer: Managed Care, Other (non HMO) | Admitting: Surgery

## 2014-03-15 VITALS — BP 150/82 | HR 64 | Temp 98.9°F | Ht 61.0 in | Wt 163.0 lb

## 2014-03-15 DIAGNOSIS — Z85850 Personal history of malignant neoplasm of thyroid: Secondary | ICD-10-CM

## 2014-03-15 NOTE — Progress Notes (Signed)
Subjective:     Patient ID: Virginia Cox, female   DOB: September 09, 1945, 68 y.o.   MRN: [id number]  HPI She is now several years out status post right thyroid lobectomy with the findings of a 2 mm focus of papillary thyroid cancer.  after a long discussion,  She elected not to have a total thyroidectomy and we have been following her with physical examinations and serial ultrasounds. She has no complaints. Her last ultrasound was 2 years ago  Review of Systems     Objective:   Physical Exam    on exam, her neck is soft. There is no adenopathy and no residual thyroid nodule Assessment:     History thyroid cancer     Plan:     I will schedule her for an ultrasound for followup she wants to hold off for another year. After a long discussion, she will continue her self examinations I will wait one more year. She understands that this may miss a residual cancer or developing nodule.  Should she change her mind or feel a nodule in her neck, she will come back and see me sooner.

## 2014-03-21 ENCOUNTER — Other Ambulatory Visit: Payer: Self-pay | Admitting: Internal Medicine

## 2014-03-24 ENCOUNTER — Other Ambulatory Visit: Payer: Self-pay | Admitting: Internal Medicine

## 2014-05-08 ENCOUNTER — Telehealth: Payer: Self-pay | Admitting: Internal Medicine

## 2014-05-08 DIAGNOSIS — E0590 Thyrotoxicosis, unspecified without thyrotoxic crisis or storm: Secondary | ICD-10-CM

## 2014-05-08 NOTE — Telephone Encounter (Signed)
Advise pt, needs labs: TSH , free T3 and Free T4 -- dx hyperthyroidism

## 2014-05-10 NOTE — Telephone Encounter (Signed)
Letter printed and mailed to Pt informing her that she is due for TSH check and to call office to make appt. Labs ordered.

## 2014-06-26 ENCOUNTER — Other Ambulatory Visit: Payer: Self-pay | Admitting: Internal Medicine

## 2014-07-29 ENCOUNTER — Other Ambulatory Visit: Payer: Self-pay | Admitting: Internal Medicine

## 2015-04-13 ENCOUNTER — Other Ambulatory Visit: Payer: Self-pay | Admitting: Internal Medicine

## 2015-04-19 ENCOUNTER — Other Ambulatory Visit: Payer: Self-pay | Admitting: Internal Medicine

## 2015-05-01 ENCOUNTER — Other Ambulatory Visit: Payer: Self-pay | Admitting: Internal Medicine

## 2015-05-02 ENCOUNTER — Other Ambulatory Visit: Payer: Self-pay

## 2015-05-17 ENCOUNTER — Other Ambulatory Visit: Payer: Self-pay | Admitting: Internal Medicine

## 2015-06-17 ENCOUNTER — Telehealth: Payer: Self-pay

## 2015-06-20 ENCOUNTER — Encounter: Payer: Self-pay | Admitting: Internal Medicine

## 2015-06-20 ENCOUNTER — Ambulatory Visit (INDEPENDENT_AMBULATORY_CARE_PROVIDER_SITE_OTHER): Payer: Managed Care, Other (non HMO) | Admitting: Internal Medicine

## 2015-06-20 VITALS — BP 124/86 | HR 69 | Temp 98.0°F | Ht 61.0 in | Wt 165.4 lb

## 2015-06-20 DIAGNOSIS — Z09 Encounter for follow-up examination after completed treatment for conditions other than malignant neoplasm: Secondary | ICD-10-CM

## 2015-06-20 DIAGNOSIS — Z0000 Encounter for general adult medical examination without abnormal findings: Secondary | ICD-10-CM | POA: Diagnosis not present

## 2015-06-20 DIAGNOSIS — C73 Malignant neoplasm of thyroid gland: Secondary | ICD-10-CM

## 2015-06-20 DIAGNOSIS — R0789 Other chest pain: Secondary | ICD-10-CM

## 2015-06-20 NOTE — Progress Notes (Signed)
Subjective:    Patient ID: Virginia Cox, female    DOB: 1946/01/19, 69 y.o.   MRN: [id number]  DOS:  06/20/2015 Type of visit - description : Yearly checkup Interval history: No concerns today   Review of Systems Constitutional: No fever. No chills. No unexplained wt changes. No unusual sweats  HEENT: No dental problems, no ear discharge, no facial swelling, no voice changes. No eye discharge, no eye  redness , no  intolerance to light   Respiratory: No wheezing , no  difficulty breathing. No cough , no mucus production  Cardiovascular: No CP, no leg swelling , no  Palpitations  GI: no nausea, no vomiting, no diarrhea , no  abdominal pain.  No blood in the stools. No dysphagia, no odynophagia    Endocrine: No polyphagia, no polyuria , no polydipsia  GU: No dysuria, gross hematuria, difficulty urinating. No urinary urgency, no frequency.  Musculoskeletal: No joint swellings or unusual aches or pains  Skin: No change in the color of the skin, palor , no  Rash  Allergic, immunologic: No environmental allergies , no  food allergies  Neurological: No dizziness no  syncope. No headaches. No diplopia, no slurred, no slurred speech, no motor deficits, no facial  Numbness  Hematological: No enlarged lymph nodes, no easy bruising , no unusual bleedings  Psychiatry: No suicidal ideas, no hallucinations, no beavior problems, no confusion.  No unusual/severe anxiety, no depression   Past Medical History  Diagnosis Date  . Allergic rhinitis   . Hypertension   . Thyroid cancer ([place]) 1-11     R papillary microcarcinoma  . Arthritis   . Hyperlipidemia     Past Surgical History  Procedure Laterality Date  . Cesarean section      x 1  . Tubal ligation    . Thyroidectomy  1/11       - papillary microcarcinoma    Social History   Social History  . Marital Status: Divorced    Spouse Name: N/A  . Number of Children: 3  . Years of Education: N/A   Occupational  History  . Inspector     Social History Main Topics  . Smoking status: Never Smoker   . Smokeless tobacco: Never Used  . Alcohol Use: No  . Drug Use: No  . Sexual Activity: Not on file   Other Topics Concern  . Not on file   Social History Narrative   divorced , 3 kids , mother lives w/ her        Family History  Problem Relation Age of Onset  . Coronary artery disease Father     MI 80s  . Coronary artery disease Brother     MI 12  . Hypertension Mother   . Diabetes Neg Hx   . Colon cancer Neg Hx   . Breast cancer Neg Hx   . Stroke Neg Hx        Medication List       This list is accurate as of: 06/20/15  7:32 PM.  Always use your most recent med list.               ALLEGRA ALLERGY 180 MG tablet  Generic drug:  fexofenadine  Take 180 mg by mouth daily. OTC     atorvastatin 10 MG tablet  Commonly known as:  LIPITOR  Take 1 tablet (10 mg total) by mouth daily.     fluticasone 50 MCG/ACT nasal spray  Commonly known as:  FLONASE  Place 2 sprays into the nose daily.     potassium chloride SA 20 MEQ tablet  Commonly known as:  KLOR-CON M20  Take 1 tablet (20 mEq total) by mouth daily.     triamterene-hydrochlorothiazide 37.5-25 MG tablet  Commonly known as:  MAXZIDE-25  Take 1 tablet by mouth daily.           Objective:   Physical Exam BP 124/86 mmHg  Pulse 69  Temp(Src) 98 F (36.7 C) (Oral)  Ht 5\' 1"  (1.549 m)  Wt 165 lb 6.4 oz (75.025 kg)  BMI 31.27 kg/m2  SpO2 97% General:   Well developed, well nourished . NAD.  Neck:  Full range of motion. Supple. Palpable thyroid on the right? HEENT:  Normocephalic . Face symmetric, atraumatic Lungs:  CTA B Normal respiratory effort, no intercostal retractions, no accessory muscle use. Heart: RRR,  no murmur.  No pretibial edema bilaterally  Abdomen:  Not distended, soft, non-tender.  Skin: Exposed areas without rash. Not pale. Not jaundice Neurologic:  alert & oriented X3.  Speech normal,  gait appropriate for age and unassisted Strength symmetric and appropriate for age.  Psych: Cognition and judgment appear intact.  Cooperative with normal attention span and concentration.  Behavior appropriate. No anxious or depressed appearing.    Assessment & Plan:   Assessment > HTN Hyperlipidemia Thyroid cancer 2011, right-sided papillary microcarcinoma, thyroidectomy    PLAN: HTN: Seems well-controlled, labs, refill as needed Hyperlipidemia: On Lipitor, labs Thyroid cancer: Status post surgery, reluctant  to have a follow-up. I told the patient there is a ?  right side enlargement of her thyroid , she eventually agreed to see Dr. Ninfa Linden, referral done RTC one year

## 2015-06-20 NOTE — Patient Instructions (Signed)
  Please schedule labs to be done within few days (fasting)  Check the  blood pressure 2 or 3 times a month    Be sure your blood pressure is between 110/65 and  145/85.  if it is consistently higher or lower, let me know   Next visit  for a physical exam in one year, fasting        Please schedule an appointment at the front desk

## 2015-06-20 NOTE — Assessment & Plan Note (Addendum)
Tdap 01-2012 zostavax  , pneumonia and flu shot--->  declined again Declined a colonoscopy again or ANY other CCS h/o colposcopy for an abnormal PAP, repeated and last Pap Smear 2007--->  normal . Has not seen gyn in a while- DECLINED referral  declined MMG rec dexa --- declined  Benefits of screening discussed  Diet-exercise discussed

## 2015-06-20 NOTE — Progress Notes (Signed)
Pre visit review using our clinic review tool, if applicable. No additional management support is needed unless otherwise documented below in the visit note. 

## 2015-06-20 NOTE — Assessment & Plan Note (Signed)
HTN: Seems well-controlled, labs, refill as needed Hyperlipidemia: On Lipitor, labs Thyroid cancer: Status post surgery, reluctant  to have a follow-up. I told the patient there is a ?  right side enlargement of her thyroid , she eventually agreed to see Dr. Ninfa Linden, referral done RTC one year

## 2015-06-27 ENCOUNTER — Other Ambulatory Visit (INDEPENDENT_AMBULATORY_CARE_PROVIDER_SITE_OTHER): Payer: Managed Care, Other (non HMO)

## 2015-06-27 DIAGNOSIS — R0789 Other chest pain: Secondary | ICD-10-CM

## 2015-06-27 DIAGNOSIS — C73 Malignant neoplasm of thyroid gland: Secondary | ICD-10-CM

## 2015-06-27 DIAGNOSIS — Z0000 Encounter for general adult medical examination without abnormal findings: Secondary | ICD-10-CM

## 2015-06-27 LAB — LIPID PANEL
Cholesterol: 167 mg/dL (ref 0–200)
HDL: 67.4 mg/dL (ref >39.00)
LDL CALC: 89 mg/dL (ref 0–99)
NONHDL: 99.24
Total CHOL/HDL Ratio: 2
Triglycerides: 53 mg/dL (ref 0.0–149.0)
VLDL: 10.6 mg/dL (ref 0.0–40.0)

## 2015-06-27 LAB — COMPREHENSIVE METABOLIC PANEL
ALT: 12 U/L (ref 0–35)
AST: 16 U/L (ref 0–37)
Albumin: 4 g/dL (ref 3.5–5.2)
Alkaline Phosphatase: 74 U/L (ref 39–117)
BUN: 15 mg/dL (ref 6–23)
CHLORIDE: 104 meq/L (ref 96–112)
CO2: 30 meq/L (ref 19–32)
Calcium: 9.1 mg/dL (ref 8.4–10.5)
Creatinine, Ser: 0.98 mg/dL (ref 0.40–1.20)
GFR: 72.71 mL/min (ref >60.00)
GLUCOSE: 92 mg/dL (ref 70–99)
POTASSIUM: 3.7 meq/L (ref 3.5–5.1)
SODIUM: 141 meq/L (ref 135–145)
TOTAL PROTEIN: 7 g/dL (ref 6.0–8.3)
Total Bilirubin: 0.5 mg/dL (ref 0.2–1.2)

## 2015-06-27 LAB — CBC
HEMATOCRIT: 40.7 % (ref 36.0–46.0)
HEMOGLOBIN: 13.5 g/dL (ref 12.0–15.0)
MCHC: 33.1 g/dL (ref 30.0–36.0)
MCV: 91.6 fl (ref 78.0–100.0)
Platelets: 361 10*3/uL (ref 150.0–400.0)
RBC: 4.44 Mil/uL (ref 3.87–5.11)
RDW: 13.3 % (ref 11.5–15.5)
WBC: 8.4 10*3/uL (ref 4.0–10.5)

## 2015-06-27 LAB — T3, FREE: T3, Free: 3.2 pg/mL (ref 2.3–4.2)

## 2015-06-27 LAB — TSH: TSH: 1.93 u[IU]/mL (ref 0.35–4.50)

## 2015-06-27 LAB — T4, FREE: Free T4: 0.73 ng/dL (ref 0.60–1.60)

## 2015-06-28 LAB — HEPATITIS C ANTIBODY: HCV AB: NEGATIVE

## 2015-08-07 ENCOUNTER — Other Ambulatory Visit: Payer: Self-pay | Admitting: Internal Medicine

## 2015-08-08 ENCOUNTER — Other Ambulatory Visit: Payer: Self-pay | Admitting: Internal Medicine

## 2015-08-12 ENCOUNTER — Telehealth: Payer: Self-pay | Admitting: Internal Medicine

## 2015-08-12 NOTE — Telephone Encounter (Signed)
Pt declined flu shot 06/20/15 appt

## 2015-08-12 NOTE — Telephone Encounter (Signed)
Flu shot denied in chart

## 2015-08-20 ENCOUNTER — Other Ambulatory Visit: Payer: Self-pay | Admitting: Internal Medicine

## 2015-09-20 NOTE — Telephone Encounter (Signed)
Pre visit call completed 

## 2016-08-30 ENCOUNTER — Other Ambulatory Visit: Payer: Self-pay | Admitting: Internal Medicine

## 2016-09-29 ENCOUNTER — Other Ambulatory Visit: Payer: Self-pay | Admitting: Internal Medicine

## 2016-10-03 ENCOUNTER — Telehealth: Payer: Self-pay | Admitting: Internal Medicine

## 2016-10-03 ENCOUNTER — Other Ambulatory Visit: Payer: Self-pay | Admitting: Internal Medicine

## 2016-10-03 MED ORDER — ATORVASTATIN CALCIUM 10 MG PO TABS: 10.0000 mg | ORAL_TABLET | Freq: Every day | ORAL | 1 refills | Status: DC

## 2016-10-03 MED ORDER — TRIAMTERENE-HCTZ 37.5-25 MG PO TABS: 1.0000 | ORAL_TABLET | Freq: Every day | ORAL | 1 refills | Status: DC

## 2016-10-03 MED ORDER — POTASSIUM CHLORIDE CRYS ER 20 MEQ PO TBCR: 20.0000 meq | EXTENDED_RELEASE_TABLET | Freq: Every day | ORAL | 1 refills | Status: DC

## 2016-10-03 NOTE — Telephone Encounter (Signed)
Rxs sent

## 2016-10-03 NOTE — Telephone Encounter (Signed)
Caller name: Deshaun Relationship to patient: self Can be reached: [phone] Pharmacy: [id number] - [place], [place].  Reason for call: Pt scheduled appt 11/09/16 cpe. Pt asking if refills can be sent in until her appt. She needs atorvastatin, potassium, and triamterene. Pt is out.

## 2016-10-03 NOTE — Telephone Encounter (Signed)
Okay #30 days and one refill

## 2016-10-03 NOTE — Telephone Encounter (Signed)
Please advise, last OV 06/2015.

## 2016-11-09 ENCOUNTER — Ambulatory Visit (INDEPENDENT_AMBULATORY_CARE_PROVIDER_SITE_OTHER): Payer: Managed Care, Other (non HMO) | Admitting: Internal Medicine

## 2016-11-09 ENCOUNTER — Encounter: Payer: Self-pay | Admitting: Internal Medicine

## 2016-11-09 VITALS — BP 126/78 | HR 59 | Temp 98.2°F | Resp 14 | Ht 61.0 in | Wt 161.0 lb

## 2016-11-09 DIAGNOSIS — Z0000 Encounter for general adult medical examination without abnormal findings: Secondary | ICD-10-CM | POA: Diagnosis not present

## 2016-11-09 LAB — CBC WITH DIFFERENTIAL/PLATELET
Basophils Absolute: 0 cells/uL (ref 0–200)
Basophils Relative: 0 %
EOS ABS: 297 {cells}/uL (ref 15–500)
Eosinophils Relative: 3 %
HEMATOCRIT: 40.8 % (ref 35.0–45.0)
Hemoglobin: 13.4 g/dL (ref 11.7–15.5)
LYMPHS PCT: 29 %
Lymphs Abs: 2871 cells/uL (ref 850–3900)
MCH: 30.1 pg (ref 27.0–33.0)
MCHC: 32.8 g/dL (ref 32.0–36.0)
MCV: 91.7 fL (ref 80.0–100.0)
MONO ABS: 693 {cells}/uL (ref 200–950)
MPV: 9.7 fL (ref 7.5–12.5)
Monocytes Relative: 7 %
Neutro Abs: 6039 cells/uL (ref 1500–7800)
Neutrophils Relative %: 61 %
Platelets: 376 10*3/uL (ref 140–400)
RBC: 4.45 MIL/uL (ref 3.80–5.10)
RDW: 13.5 % (ref 11.0–15.0)
WBC: 9.9 10*3/uL (ref 3.8–10.8)

## 2016-11-09 LAB — COMPREHENSIVE METABOLIC PANEL
ALK PHOS: 66 U/L (ref 33–130)
ALT: 11 U/L (ref 6–29)
AST: 15 U/L (ref 10–35)
Albumin: 4 g/dL (ref 3.6–5.1)
BUN: 14 mg/dL (ref 7–25)
CALCIUM: 9.2 mg/dL (ref 8.6–10.4)
CO2: 25 mmol/L (ref 20–31)
Chloride: 105 mmol/L (ref 98–110)
Creat: 1.06 mg/dL — ABNORMAL HIGH (ref 0.50–0.99)
GLUCOSE: 86 mg/dL (ref 65–99)
POTASSIUM: 3.6 mmol/L (ref 3.5–5.3)
SODIUM: 142 mmol/L (ref 135–146)
TOTAL PROTEIN: 6.9 g/dL (ref 6.1–8.1)
Total Bilirubin: 0.4 mg/dL (ref 0.2–1.2)

## 2016-11-09 LAB — LIPID PANEL
CHOL/HDL RATIO: 2.1 ratio (ref <5.0)
CHOLESTEROL: 177 mg/dL (ref <200)
HDL: 83 mg/dL (ref >50)
LDL Cholesterol: 80 mg/dL (ref <100)
Triglycerides: 69 mg/dL (ref <150)
VLDL: 14 mg/dL (ref <30)

## 2016-11-09 MED ORDER — TRIAMTERENE-HCTZ 37.5-25 MG PO TABS: 1.0000 | ORAL_TABLET | Freq: Every day | ORAL | 6 refills | Status: DC

## 2016-11-09 MED ORDER — ATORVASTATIN CALCIUM 10 MG PO TABS
10.0000 mg | ORAL_TABLET | Freq: Every day | ORAL | 6 refills | Status: DC
Start: 2016-11-09 — End: 2017-07-23

## 2016-11-09 MED ORDER — POTASSIUM CHLORIDE CRYS ER 20 MEQ PO TBCR: 20.0000 meq | EXTENDED_RELEASE_TABLET | Freq: Every day | ORAL | 6 refills | Status: DC

## 2016-11-09 NOTE — Progress Notes (Signed)
Pre visit review using our clinic review tool, if applicable. No additional management support is needed unless otherwise documented below in the visit note. 

## 2016-11-09 NOTE — Patient Instructions (Signed)
GO TO THE LAB : Get the blood work     GO TO THE FRONT DESK Schedule your next appointment for a  complete physical exam in one year, fasting   Check the  blood pressure 2 or 3 times a month   Be sure your blood pressure is between 110/65 and  145/85. If it is consistently higher or lower, let me know

## 2016-11-09 NOTE — Assessment & Plan Note (Addendum)
--  Tdap 01-2012. Strongly and consistently  declines further immunizations, colon, breast or cervical cancer screening. Benefits discussed. --Labs: CMP, FLP, CBC, TSH --Diet and  exercise discussed.  --RTC one year

## 2016-11-09 NOTE — Progress Notes (Signed)
Subjective:    Patient ID: Virginia Cox, female    DOB: 1946/01/31, 71 y.o.   MRN: [id number]  DOS:  11/09/2016 Type of visit - description : CPX Interval history: no concerns   Review of Systems  A 14 point review of systems is negative   Past Medical History:  Diagnosis Date  . Allergic rhinitis   . Arthritis   . Hyperlipidemia   . Hypertension   . Thyroid cancer ([place]) 1-11    R papillary microcarcinoma    Past Surgical History:  Procedure Laterality Date  . CESAREAN SECTION     x 1  . THYROIDECTOMY  1/11      - papillary microcarcinoma  . TUBAL LIGATION      Social History   Social History  . Marital status: Divorced    Spouse name: N/A  . Number of children: 3  . Years of education: N/A   Occupational History  . Inspector     Social History Main Topics  . Smoking status: Never Smoker  . Smokeless tobacco: Never Used  . Alcohol use No  . Drug use: No  . Sexual activity: Not on file   Other Topics Concern  . Not on file   Social History Narrative   divorced , 3 kids , mother lives w/ her mother 69 y/o), 1 daughter         Family History  Problem Relation Age of Onset  . Coronary artery disease Father     MI 25s  . Hypertension Mother   . Coronary artery disease Brother     MI 73  . Diabetes Neg Hx   . Colon cancer Neg Hx   . Breast cancer Neg Hx   . Stroke Neg Hx      Allergies as of 11/09/2016   No Known Allergies     Medication List       Accurate as of 11/09/16 11:59 PM. Always use your most recent med list.          ALLEGRA ALLERGY 180 MG tablet Generic drug:  fexofenadine Take 180 mg by mouth daily. OTC   atorvastatin 10 MG tablet Commonly known as:  LIPITOR Take 1 tablet (10 mg total) by mouth daily.   fluticasone 50 MCG/ACT nasal spray Commonly known as:  FLONASE Place 2 sprays into the nose daily.   potassium chloride SA 20 MEQ tablet Commonly known as:  KLOR-CON M20 Take 1 tablet (20 mEq total) by mouth  daily.   triamterene-hydrochlorothiazide 37.5-25 MG tablet Commonly known as:  MAXZIDE-25 Take 1 tablet by mouth daily.          Objective:   Physical Exam BP 126/78 (BP Location: Left Arm, Patient Position: Sitting, Cuff Size: Normal)   Pulse (!) 59   Temp 98.2 F (36.8 C) (Oral)   Resp 14   Ht 5\' 1"  (1.549 m)   Wt 161 lb (73 kg)   SpO2 95%   BMI 30.42 kg/m   General:   Well developed, well nourished . NAD.  Neck: Surgical scar from thyroidectomy, palpation with no mass or LAD. HEENT:  Normocephalic . Face symmetric, atraumatic Lungs:  CTA B Normal respiratory effort, no intercostal retractions, no accessory muscle use. Heart: RRR,  no murmur.  No pretibial edema bilaterally  Abdomen:  Not distended, soft, non-tender. No rebound or rigidity.   Skin: Exposed areas without rash. Not pale. Not jaundice Neurologic:  alert & oriented X3.  Speech normal, gait appropriate  for age and unassisted Strength symmetric and appropriate for age.  Psych: Cognition and judgment appear intact.  Cooperative with normal attention span and concentration.  Behavior appropriate. No anxious or depressed appearing.    Assessment & Plan:   Assessment  HTN Hyperlipidemia Thyroid cancer 2011, right-sided papillary microcarcinoma, thyroidectomy    PLAN: HTN: Reports good compliance Maxzide, potassium, labs and refills provided Hyperlipidemia: Reports good compliance with Lipitor, labs and refills today Thyroid cancer : saw surgery 06/2015, at the time she declined a [place]. She is due to see surgery again and is over due for [place]: declined both.  RTC one year

## 2016-11-10 LAB — TSH: TSH: 1.29 mIU/L

## 2016-11-10 NOTE — Assessment & Plan Note (Signed)
HTN: Reports good compliance Maxzide, potassium, labs and refills provided Hyperlipidemia: Reports good compliance with Lipitor, labs and refills today Thyroid cancer : saw surgery 06/2015, at the time she declined a [place]. She is due to see surgery again and is over due for [place]: declined both.  RTC one year

## 2017-07-23 ENCOUNTER — Other Ambulatory Visit: Payer: Self-pay | Admitting: Internal Medicine

## 2017-11-15 ENCOUNTER — Encounter: Payer: Self-pay | Admitting: Internal Medicine

## 2017-11-15 ENCOUNTER — Ambulatory Visit (INDEPENDENT_AMBULATORY_CARE_PROVIDER_SITE_OTHER): Payer: Medicare Other | Admitting: Internal Medicine

## 2017-11-15 VITALS — BP 126/64 | HR 67 | Temp 97.9°F | Resp 14 | Ht 61.0 in | Wt 149.1 lb

## 2017-11-15 DIAGNOSIS — Z85850 Personal history of malignant neoplasm of thyroid: Secondary | ICD-10-CM

## 2017-11-15 DIAGNOSIS — Z634 Disappearance and death of family member: Secondary | ICD-10-CM

## 2017-11-15 DIAGNOSIS — E785 Hyperlipidemia, unspecified: Secondary | ICD-10-CM

## 2017-11-15 DIAGNOSIS — Z0000 Encounter for general adult medical examination without abnormal findings: Secondary | ICD-10-CM | POA: Diagnosis not present

## 2017-11-15 DIAGNOSIS — I10 Essential (primary) hypertension: Secondary | ICD-10-CM | POA: Diagnosis not present

## 2017-11-15 NOTE — Patient Instructions (Signed)
He is come back fasting next week, make an appointment.  We will check your labs  Next visit with me in 1 year, please make an appointment

## 2017-11-15 NOTE — Assessment & Plan Note (Addendum)
--  Tdap 01-2012. Strongly and consistently  declines further immunizations; also colon, breast or cervical cancer screening.  Today she also declined a bone density test.  --Labs: Return fasting for BMP, AST, ALT, FLP, CBC, TSH --Diet and  exercise discussed.  --RTC one year

## 2017-11-15 NOTE — Progress Notes (Signed)
Subjective:    Patient ID: Virginia Cox, female    DOB: 02-27-1946, 72 y.o.   MRN: [id number]  DOS:  11/15/2017 Type of visit - description : cpx Interval history: Physically doing well, unfortunately she lost her mother 09-2017.  They were very close.  Patient is obviously grieving.  Very emotional today.  Denies any suicidal ideas   Review of Systems History of  thyroid cancer.  Self neck exam normal.  Other than above, a 14 point review of systems is negative      Past Medical History:  Diagnosis Date  . Allergic rhinitis   . Arthritis   . Hyperlipidemia   . Hypertension   . Thyroid cancer ([place]) 1-11    R papillary microcarcinoma    Past Surgical History:  Procedure Laterality Date  . CESAREAN SECTION     x 1  . THYROIDECTOMY  1/11      - papillary microcarcinoma  . TUBAL LIGATION      Social History   Socioeconomic History  . Marital status: Divorced    Spouse name: Not on file  . Number of children: 3  . Years of education: Not on file  . Highest education level: Not on file  Occupational History  . Occupation: quit 09/2017; [employer]   Social Needs  . Financial resource strain: Not on file  . Food insecurity:    Worry: Not on file    Inability: Not on file  . Transportation needs:    Medical: Not on file    Non-medical: Not on file  Tobacco Use  . Smoking status: Never Smoker  . Smokeless tobacco: Never Used  Substance and Sexual Activity  . Alcohol use: No  . Drug use: No  . Sexual activity: Not on file  Lifestyle  . Physical activity:    Days per week: Not on file    Minutes per session: Not on file  . Stress: Not on file  Relationships  . Social connections:    Talks on phone: Not on file    Gets together: Not on file    Attends religious service: Not on file    Active member of club or organization: Not on file    Attends meetings of clubs or organizations: Not on file    Relationship status: Not on file  . Intimate partner  violence:    Fear of current or ex partner: Not on file    Emotionally abused: Not on file    Physically abused: Not on file    Forced sexual activity: Not on file  Other Topics Concern  . Not on file  Social History Narrative   divorced , 3 kids , lost mother 09-2017, she was 63 y/o, lived w/ her mom all her life   1 daughter lives w/ her   1 daughter in [place]   1 daughter in [place]    r         Family History  Problem Relation Age of Onset  . Coronary artery disease Father        MI 1s  . Hypertension Mother   . Coronary artery disease Brother        MI 61  . Diabetes Neg Hx   . Colon cancer Neg Hx   . Breast cancer Neg Hx   . Stroke Neg Hx      Allergies as of 11/15/2017   No Known Allergies     Medication List  Accurate as of 11/15/17 11:59 PM. Always use your most recent med list.          ALLEGRA ALLERGY 180 MG tablet Generic drug:  fexofenadine Take 180 mg by mouth daily. OTC   atorvastatin 10 MG tablet Commonly known as:  LIPITOR Take 1 tablet (10 mg total) by mouth daily.   fluticasone 50 MCG/ACT nasal spray Commonly known as:  FLONASE Place 2 sprays into the nose daily.   potassium chloride SA 20 MEQ tablet Commonly known as:  KLOR-CON M20 Take 1 tablet (20 mEq total) by mouth daily.   triamterene-hydrochlorothiazide 37.5-25 MG tablet Commonly known as:  MAXZIDE-25 Take 1 tablet by mouth daily.          Objective:   Physical Exam BP 126/64 (BP Location: Right Arm, Patient Position: Sitting, Cuff Size: Small)   Pulse 67   Temp 97.9 F (36.6 C) (Oral)   Resp 14   Ht 5\' 1"  (1.549 m)   Wt 149 lb 2 oz (67.6 kg)   SpO2 97%   BMI 28.18 kg/m  General:   Well developed, well nourished . NAD.  Neck: No  thyromegaly, no mass or LAD HEENT:  Normocephalic . Face symmetric, atraumatic Lungs:  CTA B Normal respiratory effort, no intercostal retractions, no accessory muscle use. Heart: RRR,  no murmur.  No pretibial edema bilaterally    Abdomen:  Not distended, soft, non-tender. No rebound or rigidity.   Skin: Exposed areas without rash. Not pale. Not jaundice Neurologic:  alert & oriented X3.  Speech normal, gait appropriate for age and unassisted Strength symmetric and appropriate for age.  Psych: Very emotional when we talk about her mother     Assessment & Plan:   Assessment  HTN Hyperlipidemia Thyroid cancer 2011, right-sided papillary microcarcinoma, thyroidectomy    PLAN: HTN: Seems well controlled with Maxide and potassium, reports good ambulatory BPs, checking labs Hyperlipidemia: On Lipitor, checking labs H/o thyroid cancer: overdue for a visit to see the surgeon and a ultrasound, again declined further eval.  Grieving: Lost her mother 09-2017, she is counseled to the best of my ability, strongly encouraged to call me if she feels is not improving, may need counseling or medications although she says she probably will not take any medicines. RTC 1 year

## 2017-11-15 NOTE — Progress Notes (Signed)
Pre visit review using our clinic review tool, if applicable. No additional management support is needed unless otherwise documented below in the visit note. 

## 2017-11-16 NOTE — Assessment & Plan Note (Signed)
HTN: Seems well controlled with Maxide and potassium, reports good ambulatory BPs, checking labs Hyperlipidemia: On Lipitor, checking labs H/o thyroid cancer: overdue for a visit to see the surgeon and a ultrasound, again declined further eval.  Grieving: Lost her mother 09-2017, she is counseled to the best of my ability, strongly encouraged to call me if she feels is not improving, may need counseling or medications although she says she probably will not take any medicines. RTC 1 year

## 2017-11-18 ENCOUNTER — Other Ambulatory Visit (INDEPENDENT_AMBULATORY_CARE_PROVIDER_SITE_OTHER): Payer: Medicare Other

## 2017-11-18 DIAGNOSIS — Z0000 Encounter for general adult medical examination without abnormal findings: Secondary | ICD-10-CM | POA: Diagnosis not present

## 2017-11-18 DIAGNOSIS — E785 Hyperlipidemia, unspecified: Secondary | ICD-10-CM

## 2017-11-18 DIAGNOSIS — I10 Essential (primary) hypertension: Secondary | ICD-10-CM | POA: Diagnosis not present

## 2017-11-18 LAB — CBC WITH DIFFERENTIAL/PLATELET
BASOS ABS: 0 10*3/uL (ref 0.0–0.1)
BASOS PCT: 0.5 % (ref 0.0–3.0)
Eosinophils Absolute: 0.2 10*3/uL (ref 0.0–0.7)
Eosinophils Relative: 2.2 % (ref 0.0–5.0)
HEMATOCRIT: 42 % (ref 36.0–46.0)
Hemoglobin: 14.1 g/dL (ref 12.0–15.0)
LYMPHS ABS: 2.5 10*3/uL (ref 0.7–4.0)
Lymphocytes Relative: 33.3 % (ref 12.0–46.0)
MCHC: 33.7 g/dL (ref 30.0–36.0)
MCV: 92.3 fl (ref 78.0–100.0)
MONO ABS: 0.5 10*3/uL (ref 0.1–1.0)
Monocytes Relative: 7.1 % (ref 3.0–12.0)
NEUTROS ABS: 4.2 10*3/uL (ref 1.4–7.7)
NEUTROS PCT: 56.9 % (ref 43.0–77.0)
PLATELETS: 388 10*3/uL (ref 150.0–400.0)
RBC: 4.55 Mil/uL (ref 3.87–5.11)
RDW: 13.7 % (ref 11.5–15.5)
WBC: 7.4 10*3/uL (ref 4.0–10.5)

## 2017-11-18 LAB — BASIC METABOLIC PANEL
BUN: 16 mg/dL (ref 6–23)
CALCIUM: 9.8 mg/dL (ref 8.4–10.5)
CO2: 30 meq/L (ref 19–32)
Chloride: 103 mEq/L (ref 96–112)
Creatinine, Ser: 0.99 mg/dL (ref 0.40–1.20)
GFR: 71.35 mL/min (ref >60.00)
Glucose, Bld: 95 mg/dL (ref 70–99)
POTASSIUM: 4.7 meq/L (ref 3.5–5.1)
SODIUM: 142 meq/L (ref 135–145)

## 2017-11-18 LAB — LIPID PANEL
CHOL/HDL RATIO: 3
Cholesterol: 198 mg/dL (ref 0–200)
HDL: 73.6 mg/dL (ref >39.00)
LDL Cholesterol: 110 mg/dL — ABNORMAL HIGH (ref 0–99)
NonHDL: 124.6
Triglycerides: 73 mg/dL (ref 0.0–149.0)
VLDL: 14.6 mg/dL (ref 0.0–40.0)

## 2017-11-18 LAB — TSH: TSH: 2.78 u[IU]/mL (ref 0.35–4.50)

## 2017-11-18 LAB — ALT: ALT: 13 U/L (ref 0–35)

## 2017-11-18 LAB — AST: AST: 18 U/L (ref 0–37)

## 2017-12-26 ENCOUNTER — Other Ambulatory Visit: Payer: Self-pay | Admitting: Internal Medicine

## 2018-04-02 ENCOUNTER — Other Ambulatory Visit: Payer: Self-pay

## 2018-04-02 MED ORDER — TRIAMTERENE-HCTZ 37.5-25 MG PO TABS: 1.0000 | ORAL_TABLET | Freq: Every day | ORAL | 3 refills | Status: DC

## 2018-11-18 ENCOUNTER — Encounter: Payer: Medicare Other | Admitting: Internal Medicine

## 2018-12-30 ENCOUNTER — Other Ambulatory Visit: Payer: Self-pay | Admitting: Internal Medicine

## 2019-01-13 ENCOUNTER — Other Ambulatory Visit: Payer: Self-pay | Admitting: Internal Medicine

## 2019-01-24 ENCOUNTER — Other Ambulatory Visit: Payer: Self-pay | Admitting: Internal Medicine

## 2019-01-26 ENCOUNTER — Other Ambulatory Visit: Payer: Self-pay

## 2019-01-28 ENCOUNTER — Encounter: Payer: Self-pay | Admitting: Internal Medicine

## 2019-01-28 ENCOUNTER — Ambulatory Visit (INDEPENDENT_AMBULATORY_CARE_PROVIDER_SITE_OTHER): Payer: Medicare Other | Admitting: Internal Medicine

## 2019-01-28 ENCOUNTER — Other Ambulatory Visit: Payer: Self-pay

## 2019-01-28 VITALS — BP 138/76 | HR 64 | Temp 97.8°F | Resp 16 | Ht 61.0 in | Wt 160.4 lb

## 2019-01-28 DIAGNOSIS — Z0000 Encounter for general adult medical examination without abnormal findings: Secondary | ICD-10-CM | POA: Diagnosis not present

## 2019-01-28 LAB — COMPREHENSIVE METABOLIC PANEL
ALT: 12 U/L (ref 0–35)
AST: 15 U/L (ref 0–37)
Albumin: 4.3 g/dL (ref 3.5–5.2)
Alkaline Phosphatase: 88 U/L (ref 39–117)
BUN: 12 mg/dL (ref 6–23)
CO2: 32 mEq/L (ref 19–32)
Calcium: 9.4 mg/dL (ref 8.4–10.5)
Chloride: 101 mEq/L (ref 96–112)
Creatinine, Ser: 0.93 mg/dL (ref 0.40–1.20)
GFR: 71.91 mL/min (ref >60.00)
Glucose, Bld: 83 mg/dL (ref 70–99)
Potassium: 3.6 mEq/L (ref 3.5–5.1)
Sodium: 141 mEq/L (ref 135–145)
Total Bilirubin: 0.5 mg/dL (ref 0.2–1.2)
Total Protein: 7.1 g/dL (ref 6.0–8.3)

## 2019-01-28 LAB — CBC WITH DIFFERENTIAL/PLATELET
Basophils Absolute: 0 10*3/uL (ref 0.0–0.1)
Basophils Relative: 0.4 % (ref 0.0–3.0)
Eosinophils Absolute: 0.3 10*3/uL (ref 0.0–0.7)
Eosinophils Relative: 4 % (ref 0.0–5.0)
HCT: 40.4 % (ref 36.0–46.0)
Hemoglobin: 13.4 g/dL (ref 12.0–15.0)
Lymphocytes Relative: 29.7 % (ref 12.0–46.0)
Lymphs Abs: 2.5 10*3/uL (ref 0.7–4.0)
MCHC: 33.2 g/dL (ref 30.0–36.0)
MCV: 92.6 fl (ref 78.0–100.0)
Monocytes Absolute: 0.5 10*3/uL (ref 0.1–1.0)
Monocytes Relative: 5.4 % (ref 3.0–12.0)
Neutro Abs: 5.1 10*3/uL (ref 1.4–7.7)
Neutrophils Relative %: 60.5 % (ref 43.0–77.0)
Platelets: 393 10*3/uL (ref 150.0–400.0)
RBC: 4.36 Mil/uL (ref 3.87–5.11)
RDW: 13.7 % (ref 11.5–15.5)
WBC: 8.4 10*3/uL (ref 4.0–10.5)

## 2019-01-28 LAB — LIPID PANEL
Cholesterol: 191 mg/dL (ref 0–200)
HDL: 75.8 mg/dL (ref >39.00)
LDL Cholesterol: 102 mg/dL — ABNORMAL HIGH (ref 0–99)
NonHDL: 115.1
Total CHOL/HDL Ratio: 3
Triglycerides: 64 mg/dL (ref 0.0–149.0)
VLDL: 12.8 mg/dL (ref 0.0–40.0)

## 2019-01-28 LAB — TSH: TSH: 1.61 u[IU]/mL (ref 0.35–4.50)

## 2019-01-28 NOTE — Progress Notes (Signed)
Subjective:    Patient ID: Virginia Cox, female    DOB: 12/20/1945, 73 y.o.   MRN: [id number]  DOS:  01/28/2019 Type of visit - description: cpx In general feeling well.   Review of Systems Occasional right ear pain, on further questioning, the pain is actually at the lateral right side of the neck.  No radiation to the shoulder or arm.  No upper extremity paresthesias. No pain w/ chewing She thinks is related to allergies but allergies are well controlled with Allegra. Denies any hearing problems, no tinnitus no ear discharge.   Other than above, a 14 point review of systems is negative      Past Medical History:  Diagnosis Date  . Allergic rhinitis   . Arthritis   . Hyperlipidemia   . Hypertension   . 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 CESAREAN SECTION     x 1  . THYROIDECTOMY  1/11      - papillary microcarcinoma  . TUBAL LIGATION      Social History   Socioeconomic History  . Marital status: Divorced    Spouse name: Not on file  . Number of children: 3  . Years of education: Not on file  . Highest education level: Not on file  Occupational History  . Occupation: retired  Agricultural consultant   . Occupation: works at [employer] part -time  Social Needs  . Financial resource strain: Not on file  . Food insecurity    Worry: Not on file    Inability: Not on file  . Transportation needs    Medical: Not on file    Non-medical: Not on file  Tobacco Use  . Smoking status: Never Smoker  . Smokeless tobacco: Never Used  Substance and Sexual Activity  . Alcohol use: No  . Drug use: No  . Sexual activity: Not on file  Lifestyle  . Physical activity    Days per week: Not on file    Minutes per session: Not on file  . Stress: Not on file  Relationships  . Social Herbalist on phone: Not on file    Gets together: Not on file    Attends religious service: Not on file    Active  member of club or organization: Not on file    Attends meetings of clubs or organizations: Not on file    Relationship status: Not on file  . Intimate partner violence    Fear of current or ex partner: Not on file    Emotionally abused: Not on file    Physically abused: Not on file    Forced sexual activity: Not on file  Other Topics Concern  . Not on file  Social History Narrative   divorced , 3 kids , lost mother 09-2017, she was 36 y/o, lived w/ her mom all her life   1 daughter lives w/ her   1 daughter in [place]   1 daughter in [place]    r       Family History  Problem Relation Age of Onset  . Coronary artery disease Father        MI 7s  . Hypertension Mother   . Coronary artery disease Brother        MI 5  . Diabetes Neg Hx   . Colon cancer Neg Hx   . Breast cancer Neg Hx   .  Stroke Neg Hx       Allergies as of 01/28/2019   No Known Allergies     Medication List       Accurate as of January 28, 2019 11:59 PM. If you have any questions, ask your nurse or doctor.        Allegra Allergy 180 MG tablet Generic drug: fexofenadine Take 180 mg by mouth daily. OTC   atorvastatin 10 MG tablet Commonly known as: LIPITOR Take 1 tablet (10 mg total) by mouth daily.   fluticasone 50 MCG/ACT nasal spray Commonly known as: FLONASE Place 2 sprays into the nose daily.   potassium chloride SA 20 MEQ tablet Commonly known as: Klor-Con M20 Take 1 tablet (20 mEq total) by mouth daily.   triamterene-hydrochlorothiazide 37.5-25 MG tablet Commonly known as: MAXZIDE-25 Take 1 tablet by mouth daily.           Objective:   Physical Exam BP 138/76 (BP Location: Left Arm, Patient Position: Sitting, Cuff Size: Small)   Pulse 64   Temp 97.8 F (36.6 C) (Oral)   Resp 16   Ht 5\' 1"  (1.549 m)   Wt 160 lb 6 oz (72.7 kg)   SpO2 99%   BMI 30.30 kg/m   General: Well developed, NAD, BMI noted Neck: No mass or lymph nodes. No TTP of the cervical spine No TMJ click or TTP  HEENT:  Normocephalic . Face symmetric, atraumatic. TMs normal bilaterally.  Throat symmetric and not red Lungs:  CTA B Normal respiratory effort, no intercostal retractions, no accessory muscle use. Heart: RRR,  no murmur.  No pretibial edema bilaterally  Abdomen:  Not distended, soft, non-tender. No rebound or rigidity.   Skin: Exposed areas without rash. Not pale. Not jaundice Neurologic:  alert & oriented X3.  Speech normal, gait appropriate for age and unassisted Strength symmetric and appropriate for age.  Psych: Cognition and judgment appear intact.  Cooperative with normal attention span and concentration.  Behavior appropriate. No anxious or depressed appearing.     Assessment     Assessment  HTN Hyperlipidemia Thyroid cancer 2011, right-sided papillary microcarcinoma, thyroidectomy    PLAN: Here for CPX HTN: Seems well controlled on Maxide and potassium, Checking labs, recommend ambulatory BPs.  See AVS High cholesterol: On Lipitor, checking labs Thyroid cancer: Again, declined any further evaluation Grieving: No depression per se, still misses her mother.   Asked her to consider counseling for what seems to be prolonged grieving.  She is not ready for that but will think about it. "Ear pain": I think the ear is referred pain from the neck, recommend stretching the neck Tylenol or ibuprofen, if she continue with pain she will let me know. RTC 1 year

## 2019-01-28 NOTE — Patient Instructions (Signed)
GO TO THE LAB : Get the blood work     GO TO THE FRONT DESK Schedule your next appointment  For a physical in 1 year      Check the  blood pressure 2 or 3 times a month  GOAL is between 110/65 and  135/85. If it is consistently higher or lower, let me know

## 2019-01-28 NOTE — Assessment & Plan Note (Addendum)
-  Tdap 01-2012 - Strongly and consistently  declines further immunizations including a flu shots - also declining again colon, breast , osteoporosis or cervical cancer screening.   - Explained the benefit of screening including early detection (and potential treatment) for cancer. --Labs: FLP, CBC, CMP and TSH --Diet and  exercise discussed.

## 2019-01-28 NOTE — Progress Notes (Signed)
Pre visit review using our clinic review tool, if applicable. No additional management support is needed unless otherwise documented below in the visit note. 

## 2019-01-29 NOTE — Assessment & Plan Note (Signed)
Here for CPX HTN: Seems well controlled on Maxide and potassium, Checking labs, recommend ambulatory BPs.  See AVS High cholesterol: On Lipitor, checking labs Thyroid cancer: Again, declined any further evaluation Grieving: No depression per se, still misses her mother.   Asked her to consider counseling for what seems to be prolonged grieving.  She is not ready for that but will think about it. "Ear pain": I think the ear is referred pain from the neck, recommend stretching the neck Tylenol or ibuprofen, if she continue with pain she will let me know. RTC 1 year

## 2019-02-06 ENCOUNTER — Other Ambulatory Visit: Payer: Self-pay | Admitting: Internal Medicine

## 2019-02-10 ENCOUNTER — Other Ambulatory Visit: Payer: Self-pay | Admitting: Internal Medicine

## 2019-05-08 ENCOUNTER — Other Ambulatory Visit: Payer: Self-pay | Admitting: Internal Medicine

## 2020-01-29 ENCOUNTER — Ambulatory Visit (INDEPENDENT_AMBULATORY_CARE_PROVIDER_SITE_OTHER): Payer: Medicare Other | Admitting: Internal Medicine

## 2020-01-29 ENCOUNTER — Other Ambulatory Visit: Payer: Self-pay

## 2020-01-29 ENCOUNTER — Encounter: Payer: Self-pay | Admitting: Internal Medicine

## 2020-01-29 VITALS — BP 147/88 | HR 61 | Temp 98.5°F | Resp 16 | Ht 61.0 in | Wt 159.2 lb

## 2020-01-29 DIAGNOSIS — Z85850 Personal history of malignant neoplasm of thyroid: Secondary | ICD-10-CM | POA: Diagnosis not present

## 2020-01-29 DIAGNOSIS — E785 Hyperlipidemia, unspecified: Secondary | ICD-10-CM

## 2020-01-29 DIAGNOSIS — Z0000 Encounter for general adult medical examination without abnormal findings: Secondary | ICD-10-CM | POA: Diagnosis not present

## 2020-01-29 DIAGNOSIS — I10 Essential (primary) hypertension: Secondary | ICD-10-CM | POA: Diagnosis not present

## 2020-01-29 NOTE — Patient Instructions (Addendum)
Please schedule Medicare Wellness with Glenard Haring.   Check the  blood pressure monthly BP GOAL is between 110/65 and  135/85. If it is consistently higher or lower, let me know   GO TO THE LAB : Get the blood work     [place], [place] back for   a physical exam in 1 year

## 2020-01-29 NOTE — Progress Notes (Signed)
Pre visit review using our clinic review tool, if applicable. No additional management support is needed unless otherwise documented below in the visit note. 

## 2020-01-29 NOTE — Progress Notes (Signed)
Subjective:    Patient ID: Virginia Cox, female    DOB: 1946-04-07, 74 y.o.   MRN: [id number]  DOS:  01/29/2020 Type of visit - description: CPX Feeling well, no concerns.  BP Readings from Last 3 Encounters:  01/29/20 (!) 147/88  01/28/19 138/76  11/15/17 126/64     Review of Systems   A 14 point review of systems is negative    Past Medical History:  Diagnosis Date  . Allergic rhinitis   . Arthritis   . Hyperlipidemia   . Hypertension   . 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 CESAREAN SECTION     x 1  . THYROIDECTOMY  1/11      - papillary microcarcinoma  . TUBAL LIGATION      Allergies as of 01/29/2020   No Known Allergies     Medication List       Accurate as of January 29, 2020 11:59 PM. If you have any questions, ask your nurse or doctor.        Allegra Allergy 180 MG tablet Generic drug: fexofenadine Take 180 mg by mouth daily. OTC   atorvastatin 10 MG tablet Commonly known as: LIPITOR Take 1 tablet (10 mg total) by mouth daily.   fluticasone 50 MCG/ACT nasal spray Commonly known as: FLONASE Place 2 sprays into the nose daily.   potassium chloride SA 20 MEQ tablet Commonly known as: Klor-Con M20 Take 1 tablet (20 mEq total) by mouth daily.   triamterene-hydrochlorothiazide 37.5-25 MG tablet Commonly known as: MAXZIDE-25 Take 1 tablet by mouth daily.          Objective:   Physical Exam Skin:        BP (!) 147/88 (BP Location: Left Arm, Patient Position: Sitting, Cuff Size: Small)   Pulse 61   Temp 98.5 F (36.9 C) (Oral)   Resp 16   Ht 5\' 1"  (1.549 m)   Wt 159 lb 4 oz (72.2 kg)   SpO2 97%   BMI 30.09 kg/m  General: Well developed, NAD, BMI noted Neck: No  thyromegaly.  No lymphadenopathies, supraclavicular areas normal HEENT:  Normocephalic . Face symmetric, atraumatic.  Dysphonia noted. Lungs:  CTA B Normal respiratory effort, no  intercostal retractions, no accessory muscle use. Heart: RRR,  no murmur.  Abdomen:  Not distended, soft, non-tender. No rebound or rigidity.   Lower extremities: no pretibial edema bilaterally  Skin: Exposed areas without rash. Not pale. Not jaundice Neurologic:  alert & oriented X3.  Speech normal, gait appropriate for age and unassisted Strength symmetric and appropriate for age.  Psych: Cognition and judgment appear intact.  Cooperative with normal attention span and concentration.  Behavior appropriate. No anxious or depressed appearing.     Assessment     Assessment  HTN Hyperlipidemia Thyroid cancer 2011, right-sided papillary microcarcinoma, thyroidectomy    PLAN: Here for CPX [id number] Maxide, potassium, BP slightly up today, rec to check BPs once a month.  Checking labs Hyperlipidemia: On Lipitor, checking labs Thyroid cancer: Last visit with surgery few years ago, exam is benign, recommended a thyroid ultrasound for a more detailed examination of the area, patient declines. Dysphonia: She has a somewhat tremulous voice, could be from previous thyroid surgery or from essential tremor perhaps.  Rx observation. Skin lesion: Dark spot on the left leg, see physical exam, patient report is not a mole but rather a scar as she  had an injury there.  Has no change in years.  Observation. RTC 1 year.   This visit occurred during the SARS-CoV-2 public health emergency.  Safety protocols were in place, including screening questions prior to the visit, additional usage of staff PPE, and extensive cleaning of exam room while observing appropriate contact time as indicated for disinfecting solutions.

## 2020-01-30 ENCOUNTER — Encounter: Payer: Self-pay | Admitting: Internal Medicine

## 2020-01-30 LAB — COMPREHENSIVE METABOLIC PANEL
AG Ratio: 1.4 (calc) (ref 1.0–2.5)
ALT: 10 U/L (ref 6–29)
AST: 17 U/L (ref 10–35)
Albumin: 4.3 g/dL (ref 3.6–5.1)
Alkaline phosphatase (APISO): 85 U/L (ref 37–153)
BUN/Creatinine Ratio: 15 (calc) (ref 6–22)
BUN: 16 mg/dL (ref 7–25)
CO2: 26 mmol/L (ref 20–32)
Calcium: 9.7 mg/dL (ref 8.6–10.4)
Chloride: 101 mmol/L (ref 98–110)
Creat: 1.06 mg/dL — ABNORMAL HIGH (ref 0.60–0.93)
Globulin: 3 g/dL (calc) (ref 1.9–3.7)
Glucose, Bld: 79 mg/dL (ref 65–99)
Potassium: 4 mmol/L (ref 3.5–5.3)
Sodium: 140 mmol/L (ref 135–146)
Total Bilirubin: 0.7 mg/dL (ref 0.2–1.2)
Total Protein: 7.3 g/dL (ref 6.1–8.1)

## 2020-01-30 LAB — CBC WITH DIFFERENTIAL/PLATELET
Absolute Monocytes: 561 cells/uL (ref 200–950)
Basophils Absolute: 40 cells/uL (ref 0–200)
Basophils Relative: 0.5 %
Eosinophils Absolute: 119 cells/uL (ref 15–500)
Eosinophils Relative: 1.5 %
HCT: 42.1 % (ref 35.0–45.0)
Hemoglobin: 14.1 g/dL (ref 11.7–15.5)
Lymphs Abs: 2070 cells/uL (ref 850–3900)
MCH: 30.8 pg (ref 27.0–33.0)
MCHC: 33.5 g/dL (ref 32.0–36.0)
MCV: 91.9 fL (ref 80.0–100.0)
MPV: 10 fL (ref 7.5–12.5)
Monocytes Relative: 7.1 %
Neutro Abs: 5111 cells/uL (ref 1500–7800)
Neutrophils Relative %: 64.7 %
Platelets: 432 10*3/uL — ABNORMAL HIGH (ref 140–400)
RBC: 4.58 10*6/uL (ref 3.80–5.10)
RDW: 12.8 % (ref 11.0–15.0)
Total Lymphocyte: 26.2 %
WBC: 7.9 10*3/uL (ref 3.8–10.8)

## 2020-01-30 LAB — LIPID PANEL
Cholesterol: 212 mg/dL — ABNORMAL HIGH (ref <200)
HDL: 79 mg/dL (ref >=50)
LDL Cholesterol (Calc): 115 mg/dL (calc) — ABNORMAL HIGH
Non-HDL Cholesterol (Calc): 133 mg/dL (calc) — ABNORMAL HIGH (ref <130)
Total CHOL/HDL Ratio: 2.7 (calc) (ref <5.0)
Triglycerides: 81 mg/dL (ref <150)

## 2020-01-30 LAB — TSH: TSH: 2.09 mIU/L (ref 0.40–4.50)

## 2020-01-30 NOTE — Assessment & Plan Note (Signed)
Here for CPX [id number]:[id number] Maxide, potassium, BP slightly up today, rec to check BPs once a month.  Checking labs Hyperlipidemia: On Lipitor, checking labs Thyroid cancer: Last visit with surgery few years ago, exam is benign, recommended a thyroid ultrasound for a more detailed examination of the area, patient declines. Dysphonia: She has a somewhat tremulous voice, could be from previous thyroid surgery or from essential tremor perhaps.  Rx observation. Skin lesion: Dark spot on the left leg, see physical exam, patient report is not a mole but rather a scar as she had an injury there.  Has no change in years.  Observation. RTC 1 year.

## 2020-01-30 NOTE — Assessment & Plan Note (Signed)
-  Tdap 01-2012 - s/p covid vacc x 2  But still declines any other vaccinations  - also declining again colon, breast , osteoporosis or cervical cancer screening.   - Explained the benefit of screening  --Labs: FLP, CBC, CMP and TSH --Diet and  exercise discussed.

## 2020-02-01 MED ORDER — ATORVASTATIN CALCIUM 20 MG PO TABS: 20.0000 mg | ORAL_TABLET | Freq: Every day | ORAL | 3 refills | Status: DC

## 2020-02-01 NOTE — Addendum Note (Signed)
Addended byDamita Dunnings D on: 02/01/2020 01:20 PM   Modules accepted: Orders

## 2020-02-15 ENCOUNTER — Other Ambulatory Visit: Payer: Self-pay | Admitting: Internal Medicine

## 2020-02-15 MED ORDER — ATORVASTATIN CALCIUM 20 MG PO TABS: 20.0000 mg | ORAL_TABLET | Freq: Every day | ORAL | 3 refills | Status: DC

## 2020-02-24 ENCOUNTER — Other Ambulatory Visit: Payer: Self-pay | Admitting: Internal Medicine

## 2020-02-24 ENCOUNTER — Encounter: Payer: Self-pay | Admitting: Internal Medicine

## 2020-02-24 DIAGNOSIS — Z85850 Personal history of malignant neoplasm of thyroid: Secondary | ICD-10-CM | POA: Insufficient documentation

## 2020-02-24 MED ORDER — POTASSIUM CHLORIDE CRYS ER 20 MEQ PO TBCR: 20.0000 meq | EXTENDED_RELEASE_TABLET | Freq: Every day | ORAL | 3 refills | Status: DC

## 2020-02-24 NOTE — Telephone Encounter (Signed)
Medication sent.

## 2020-02-24 NOTE — Telephone Encounter (Signed)
Pt states pharmacy does not have a refill on for her potassium. She picked up the other two but still needs her Klor Con please

## 2020-04-29 NOTE — Addendum Note (Signed)
Addended by: Kelle Darting A on: 04/29/2020 03:36 PM   Modules accepted: Orders

## 2020-05-03 ENCOUNTER — Other Ambulatory Visit (INDEPENDENT_AMBULATORY_CARE_PROVIDER_SITE_OTHER): Payer: Medicare Other

## 2020-05-03 ENCOUNTER — Other Ambulatory Visit: Payer: Self-pay

## 2020-05-03 DIAGNOSIS — E785 Hyperlipidemia, unspecified: Secondary | ICD-10-CM

## 2020-05-04 LAB — ALT: ALT: 12 U/L (ref 6–29)

## 2020-05-04 LAB — LIPID PANEL
Cholesterol: 189 mg/dL (ref <200)
HDL: 69 mg/dL (ref >=50)
LDL Cholesterol (Calc): 104 mg/dL (calc) — ABNORMAL HIGH
Non-HDL Cholesterol (Calc): 120 mg/dL (calc) (ref <130)
Total CHOL/HDL Ratio: 2.7 (calc) (ref <5.0)
Triglycerides: 69 mg/dL (ref <150)

## 2020-05-04 LAB — AST: AST: 16 U/L (ref 10–35)

## 2020-05-06 MED ORDER — ATORVASTATIN CALCIUM 40 MG PO TABS
40.0000 mg | ORAL_TABLET | Freq: Every day | ORAL | 3 refills | Status: DC
Start: 2020-05-06 — End: 2020-07-29

## 2020-05-06 NOTE — Addendum Note (Signed)
Addended byDamita Dunnings D on: 05/06/2020 02:54 PM   Modules accepted: Orders

## 2020-05-18 ENCOUNTER — Other Ambulatory Visit: Payer: Self-pay | Admitting: Internal Medicine

## 2020-05-19 ENCOUNTER — Other Ambulatory Visit: Payer: Self-pay | Admitting: Internal Medicine

## 2020-07-29 ENCOUNTER — Other Ambulatory Visit: Payer: Self-pay | Admitting: Internal Medicine

## 2020-08-11 ENCOUNTER — Other Ambulatory Visit: Payer: Self-pay | Admitting: Internal Medicine

## 2020-08-16 ENCOUNTER — Ambulatory Visit: Payer: Medicare Other | Admitting: Internal Medicine

## 2020-08-23 ENCOUNTER — Other Ambulatory Visit: Payer: Self-pay

## 2020-08-23 ENCOUNTER — Ambulatory Visit (INDEPENDENT_AMBULATORY_CARE_PROVIDER_SITE_OTHER): Payer: Medicare Other | Admitting: Internal Medicine

## 2020-08-23 ENCOUNTER — Encounter: Payer: Self-pay | Admitting: Internal Medicine

## 2020-08-23 VITALS — BP 130/75 | HR 63 | Temp 98.1°F | Resp 18 | Ht 61.0 in | Wt 163.4 lb

## 2020-08-23 DIAGNOSIS — I10 Essential (primary) hypertension: Secondary | ICD-10-CM

## 2020-08-23 DIAGNOSIS — E785 Hyperlipidemia, unspecified: Secondary | ICD-10-CM | POA: Diagnosis not present

## 2020-08-23 DIAGNOSIS — R490 Dysphonia: Secondary | ICD-10-CM

## 2020-08-23 LAB — LIPID PANEL
Cholesterol: 172 mg/dL (ref 0–200)
HDL: 62.9 mg/dL (ref >39.00)
LDL Cholesterol: 93 mg/dL (ref 0–99)
NonHDL: 109.15
Total CHOL/HDL Ratio: 3
Triglycerides: 79 mg/dL (ref 0.0–149.0)
VLDL: 15.8 mg/dL (ref 0.0–40.0)

## 2020-08-23 LAB — BASIC METABOLIC PANEL
BUN: 12 mg/dL (ref 6–23)
CO2: 33 mEq/L — ABNORMAL HIGH (ref 19–32)
Calcium: 9.5 mg/dL (ref 8.4–10.5)
Chloride: 97 mEq/L (ref 96–112)
Creatinine, Ser: 0.86 mg/dL (ref 0.40–1.20)
GFR: 67.41 mL/min (ref >60.00)
Glucose, Bld: 84 mg/dL (ref 70–99)
Potassium: 3.8 mEq/L (ref 3.5–5.1)
Sodium: 138 mEq/L (ref 135–145)

## 2020-08-23 NOTE — Progress Notes (Signed)
Subjective:    Patient ID: Virginia Cox, female    DOB: 10-23-1945, 75 y.o.   MRN: [id number]  DOS:  08/23/2020 Type of visit - description: F/U  Since the last office visit she is feeling well. BP today is a slightly elevated, no recent ambulatory BPs High cholesterol: Good med compliance.  BP Readings from Last 3 Encounters:  08/23/20 130/75  01/29/20 (!) 147/88  01/28/19 138/76    Wt Readings from Last 3 Encounters:  08/23/20 163 lb 6 oz (74.1 kg)  01/29/20 159 lb 4 oz (72.2 kg)  01/28/19 160 lb 6 oz (72.7 kg)   Review of Systems See above   Past Medical History:  Diagnosis Date  . Allergic rhinitis   . Arthritis   . Hyperlipidemia   . Hypertension   . 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 CESAREAN SECTION     x 1  . THYROIDECTOMY  1/11      - papillary microcarcinoma  . TUBAL LIGATION      Allergies as of 08/23/2020   No Known Allergies     Medication List       Accurate as of August 23, 2020 11:59 PM. If you have any questions, ask your nurse or doctor.        atorvastatin 40 MG tablet Commonly known as: LIPITOR Take 1 tablet (40 mg total) by mouth at bedtime.   fexofenadine 180 MG tablet Commonly known as: ALLEGRA Take 180 mg by mouth daily. OTC   fluticasone 50 MCG/ACT nasal spray Commonly known as: FLONASE Place 2 sprays into the nose daily.   potassium chloride SA 20 MEQ tablet Commonly known as: Klor-Con M20 Take 1 tablet (20 mEq total) by mouth daily.   triamterene-hydrochlorothiazide 37.5-25 MG tablet Commonly known as: MAXZIDE-25 Take 1 tablet by mouth daily.          Objective:   Physical Exam BP 130/75   Pulse 63   Temp 98.1 F (36.7 C) (Oral)   Resp 18   Ht 5\' 1"  (1.549 m)   Wt 163 lb 6 oz (74.1 kg)   SpO2 95%   BMI 30.87 kg/m  General:   Well developed, NAD, BMI noted. HEENT:  Normocephalic . Face symmetric, atraumatic Voice is  somewhat tremulous Lungs:  CTA B Normal respiratory effort, no intercostal retractions, no accessory muscle use. Heart: RRR,  no murmur.  Lower extremities: no pretibial edema bilaterally  Skin: Not pale. Not jaundice Neurologic:  alert & oriented X3.  Speech normal, gait appropriate for age and unassisted.  Mild tremor noted Psych--  Cognition and judgment appear intact.  Cooperative with normal attention span and concentration.  Behavior appropriate. No anxious or depressed appearing.      Assessment      Assessment  HTN Hyperlipidemia Thyroid cancer 2011, right-sided papillary microcarcinoma, thyroidectomy   Aphonia, from essential tremor?Marland Kitchen  PLAN: HTN: Currently on Maxide, potassium supplements, BP today slightly up, I recheck 130/75.  Recommend no change, check ambulatory BPs, BMP. High cholesterol: Increased Lipitor to 20 mg on 01-2020, follow-up cholesterol was better but not at goal, my recommendation was to increase to 40 mg.  Good compliance, she is fasting, check FLP Dysphonia: Tremulous voice, suspect essential tremor, states daughter has the same symptoms.  Declined  further evaluation. Preventive care: Declined flu shot RTC 6 months CPX   This visit occurred during the SARS-CoV-2 public  health emergency.  Safety protocols were in place, including screening questions prior to the visit, additional usage of staff PPE, and extensive cleaning of exam room while observing appropriate contact time as indicated for disinfecting solutions.

## 2020-08-23 NOTE — Patient Instructions (Addendum)
Check the  blood pressure weekly BP GOAL is between 110/65 and  135/85. If it is consistently higher or lower, let me know GO TO THE LAB : Get the blood work    [place], [place] back for a physical exam in 6 months   Advance Directive  Advance directives are legal documents that allow you to make decisions about your health care and medical treatment in case you become unable to communicate for yourself. Advance directives let your wishes be known to family, friends, and health care providers. Discussing and writing advance directives should happen over time rather than all at once. Advance directives can be changed and updated at any time. There are different types of advance directives, such as:  Medical power of attorney.  Living will.  Do not resuscitate (DNR) order or do not attempt resuscitation (DNAR) order. Health care proxy and medical power of attorney A health care proxy is also called a health care agent. This person is appointed to make medical decisions for you when you are unable to make decisions for yourself. Generally, people ask a trusted friend or family member to act as their proxy and represent their preferences. Make sure you have an agreement with your trusted person to act as your proxy. A proxy may have to make a medical decision on your behalf if your wishes are not known. A medical power of attorney, also called a durable power of attorney for health care, is a legal document that names your health care proxy. Depending on the laws in your state, the document may need to be:  Signed.  Notarized.  Dated.  Copied.  Witnessed.  Incorporated into your medical record. You may also want to appoint a trusted person to manage your money in the event you are unable to do so. This is called a durable power of attorney for finances. It is a separate legal document from the durable power of attorney for health care. You may  choose your health care proxy or someone different to act as your agent in money matters. If you do not appoint a proxy, or there is a concern that the proxy is not acting in your best interest, a court may appoint a guardian to act on your behalf. Living will A living will is a set of instructions that state your wishes about medical care when you cannot express them yourself. Health care providers should keep a copy of your living will in your medical record. You may want to give a copy to family members or friends. To alert caregivers in case of an emergency, you can place a card in your wallet to let them know that you have a living will and where they can find it. A living will is used if you become:  Terminally ill.  Disabled.  Unable to communicate or make decisions. The following decisions should be included in your living will:  To use or not to use life support equipment, such as dialysis machines and breathing machines (ventilators).  Whether you want a DNR or DNAR order. This tells health care providers not to use cardiopulmonary resuscitation (CPR) if breathing or heartbeat stops.  To use or not to use tube feeding.  To be given or not to be given food and fluids.  Whether you want comfort (palliative) care when the goal becomes comfort rather than a cure.  Whether you want to donate your organs and tissues. A living  will does not give instructions for distributing your money and property if you should pass away. DNR or DNAR A DNR or DNAR order is a request not to have CPR in the event that your heart stops beating or you stop breathing. If a DNR or DNAR order has not been made and shared, a health care provider will try to help any patient whose heart has stopped or who has stopped breathing. If you plan to have surgery, talk with your health care provider about how your DNR or DNAR order will be followed if problems occur. What if I do not have an advance directive? Some  states assign family decision makers to act on your behalf if you do not have an advance directive. Each state has its own laws about advance directives. You may want to check with your health care provider, attorney, or state representative about the laws in your state. Summary  Advance directives are legal documents that allow you to make decisions about your health care and medical treatment in case you become unable to communicate for yourself.  The process of discussing and writing advance directives should happen over time. You can change and update advance directives at any time.  Advance directives may include a medical power of attorney, a living will, and a DNR or DNAR order. This information is not intended to replace advice given to you by your health care provider. Make sure you discuss any questions you have with your health care provider. Document Revised: 04/05/2020 Document Reviewed: 04/05/2020 Elsevier Patient Education  2021 [employer].

## 2020-08-23 NOTE — Progress Notes (Signed)
Pre visit review using our clinic review tool, if applicable. No additional management support is needed unless otherwise documented below in the visit note. 

## 2020-08-24 DIAGNOSIS — R490 Dysphonia: Secondary | ICD-10-CM | POA: Insufficient documentation

## 2020-08-24 NOTE — Assessment & Plan Note (Signed)
HTN: Currently on Maxide, potassium supplements, BP today slightly up, I recheck 130/75.  Recommend no change, check ambulatory BPs, BMP. High cholesterol: Increased Lipitor to 20 mg on 01-2020, follow-up cholesterol was better but not at goal, my recommendation was to increase to 40 mg.  Good compliance, she is fasting, check FLP Dysphonia: Tremulous voice, suspect essential tremor, states daughter has the same symptoms.  Declined  further evaluation. Preventive care: Declined flu shot RTC 6 months CPX

## 2020-08-25 MED ORDER — ATORVASTATIN CALCIUM 40 MG PO TABS
40.0000 mg | ORAL_TABLET | Freq: Every day | ORAL | 1 refills | Status: DC
Start: 2020-08-25 — End: 2021-02-22

## 2020-08-25 NOTE — Addendum Note (Signed)
Addended byDamita Dunnings D on: 08/25/2020 07:37 AM   Modules accepted: Orders

## 2020-11-17 ENCOUNTER — Other Ambulatory Visit: Payer: Self-pay | Admitting: Internal Medicine

## 2021-02-13 ENCOUNTER — Other Ambulatory Visit: Payer: Self-pay | Admitting: Internal Medicine

## 2021-02-21 ENCOUNTER — Encounter: Payer: Self-pay | Admitting: Internal Medicine

## 2021-02-21 ENCOUNTER — Ambulatory Visit (INDEPENDENT_AMBULATORY_CARE_PROVIDER_SITE_OTHER): Payer: Medicare Other | Admitting: Internal Medicine

## 2021-02-21 ENCOUNTER — Other Ambulatory Visit: Payer: Self-pay

## 2021-02-21 VITALS — BP 124/62 | HR 64 | Temp 98.1°F | Resp 15 | Ht 61.0 in | Wt 158.0 lb

## 2021-02-21 DIAGNOSIS — Z0000 Encounter for general adult medical examination without abnormal findings: Secondary | ICD-10-CM | POA: Diagnosis not present

## 2021-02-21 LAB — CBC WITH DIFFERENTIAL/PLATELET
Basophils Absolute: 0.1 10*3/uL (ref 0.0–0.1)
Basophils Relative: 0.6 % (ref 0.0–3.0)
Eosinophils Absolute: 0.2 10*3/uL (ref 0.0–0.7)
Eosinophils Relative: 1.9 % (ref 0.0–5.0)
HCT: 40.9 % (ref 36.0–46.0)
Hemoglobin: 13.8 g/dL (ref 12.0–15.0)
Lymphocytes Relative: 29.7 % (ref 12.0–46.0)
Lymphs Abs: 2.9 10*3/uL (ref 0.7–4.0)
MCHC: 33.7 g/dL (ref 30.0–36.0)
MCV: 92 fl (ref 78.0–100.0)
Monocytes Absolute: 0.6 10*3/uL (ref 0.1–1.0)
Monocytes Relative: 6.4 % (ref 3.0–12.0)
Neutro Abs: 5.9 10*3/uL (ref 1.4–7.7)
Neutrophils Relative %: 61.4 % (ref 43.0–77.0)
Platelets: 432 10*3/uL — ABNORMAL HIGH (ref 150.0–400.0)
RBC: 4.44 Mil/uL (ref 3.87–5.11)
RDW: 13.6 % (ref 11.5–15.5)
WBC: 9.7 10*3/uL (ref 4.0–10.5)

## 2021-02-21 NOTE — Patient Instructions (Addendum)
Recommend covid #4- you may do this downstairs at our pharmacy today if you would like.   Check the  blood pressure monthly BP GOAL is between 110/65 and  135/85. If it is consistently higher or lower, let me know     GO TO THE LAB : Get the blood work     [place], [place] back for   a physical exam in 1 year    "Living will", "[place] of attorney": Advanced care planning  (If you already have a living will or healthcare power of attorney, please bring the copy to be scanned in your chart.)  Advance care planning is a process that supports adults in  understanding and sharing their preferences regarding future medical care.   The patient's preferences are recorded in documents called Advance Directives.    Advanced directives are completed (and can be modified at any time) while the patient is in full mental capacity.   The documentation should be available at all times to the patient, the family and the healthcare providers.  Bring in a copy to be scanned in your chart is an excellent idea and is recommended   This legal documents direct treatment decision making and/or appoint a surrogate to make the decision if the patient is not capable to do so.    Advance directives can be documented in many types of formats,  documents have names such as:  Lliving will  Durable power of attorney for healthcare (healthcare proxy or healthcare power of attorney)  Combined directives  Physician orders for life-sustaining treatment    More information at:  [email or web address]

## 2021-02-21 NOTE — Progress Notes (Signed)
Subjective:    Patient ID: [place] Cox, female    DOB: 1945/07/27, 75 y.o.   MRN: [id number]  DOS:  02/21/2021 Type of visit - description: CPX  In the last office visit she is doing well and has no major concerns.  Review of Systems  A 14 point review of systems is negative    Past Medical History:  Diagnosis Date   Allergic rhinitis    Arthritis    Hyperlipidemia    Hypertension    Thyroid cancer ([place]) 1-11    R papillary microcarcinoma    Past Surgical History:  Procedure Laterality Date   CESAREAN SECTION     x 1   THYROIDECTOMY  1/11      - papillary microcarcinoma   TUBAL LIGATION     Social History   Socioeconomic History   Marital status: Divorced    Spouse name: Not on file   Number of children: 3   Years of education: Not on file   Highest education level: Not on file  Occupational History   Occupation: retired  Agricultural consultant    Occupation: works at [employer] part -time  Tobacco Use   Smoking status: Never   Smokeless tobacco: Never  Substance and Sexual Activity   Alcohol use: No   Drug use: No   Sexual activity: Not on file  Other Topics Concern   Not on file  Social History Narrative   divorced , lives w/ [place] daughter   3 kids , lost mother 09-2017, she was 74 y/o, lived w/ her mom all her life   1 daughter lives w/ her   1 daughter in [place]   1 daughter in [place] Determinants of Health   Financial Resource Strain: Not on file  Food Insecurity: Not on file  Transportation Needs: Not on file  Physical Activity: Not on file  Stress: Not on file  Social Connections: Not on file  Intimate Partner Violence: Not on file    Allergies as of 02/21/2021   No Known Allergies      Medication List        Accurate as of February 21, 2021 11:59 PM. If you have any questions, ask your nurse or doctor.          atorvastatin 40 MG tablet Commonly known as: LIPITOR Take 1 tablet (40 mg total) by mouth at bedtime.    fexofenadine 180 MG tablet Commonly known as: ALLEGRA Take 180 mg by mouth daily. OTC   fluticasone 50 MCG/ACT nasal spray Commonly known as: FLONASE Place 2 sprays into the nose daily.   potassium chloride SA 20 MEQ tablet Commonly known as: Klor-Con M20 Take 1 tablet (20 mEq total) by mouth daily.   triamterene-hydrochlorothiazide 37.5-25 MG tablet Commonly known as: MAXZIDE-25 Take 1 tablet by mouth daily.           Objective:   Physical Exam BP 124/62 (BP Location: Left Arm, Patient Position: Sitting, Cuff Size: Normal)   Pulse 64   Temp 98.1 F (36.7 C)   Resp 15   Ht '5\' 1"'$  (1.549 m)   Wt 158 lb (71.7 kg)   SpO2 99%   BMI 29.85 kg/m  General: Well developed, NAD, BMI noted Neck: Well-healed surgical scar, no thyromegaly that I can tell, no lymphadenopathy. HEENT:  Normocephalic . Face symmetric, atraumatic.  Aphonia noted Lungs:  CTA B Normal respiratory effort, no intercostal retractions, no accessory muscle use. Heart:  RRR,  no murmur.  Abdomen:  Not distended, soft, non-tender. No rebound or rigidity.   Lower extremities: no pretibial edema bilaterally  Skin: Exposed areas without rash. Not pale. Not jaundice Neurologic:  alert & oriented X3.  Speech normal, gait appropriate for age and unassisted Strength symmetric and appropriate for age.  Psych: Cognition and judgment appear intact.  Cooperative with normal attention span and concentration.  Behavior appropriate. No anxious or depressed appearing.     Assessment      Assessment  HTN Hyperlipidemia Thyroid cancer 2011, right-sided papillary microcarcinoma, R thyroidectomy   Aphonia, from essential tremor?Marland Kitchen  PLAN: Here for CPX HTN: BP today is very good, continue Maxide, potassium supplements, checking labs.  Encouraged to check ambulatory BPs Hyperlipidemia: On atorvastatin, checking labs Thyroid cancer, history of : again declines ultrasound to check the area. Aphonia: Seems a  stable RTC 1 year   This visit occurred during the SARS-CoV-2 public health emergency.  Safety protocols were in place, including screening questions prior to the visit, additional usage of staff PPE, and extensive cleaning of exam room while observing appropriate contact time as indicated for disinfecting solutions.

## 2021-02-22 ENCOUNTER — Encounter: Payer: Self-pay | Admitting: Internal Medicine

## 2021-02-22 ENCOUNTER — Other Ambulatory Visit: Payer: Self-pay | Admitting: Internal Medicine

## 2021-02-22 LAB — COMPREHENSIVE METABOLIC PANEL
ALT: 16 U/L (ref 0–35)
AST: 20 U/L (ref 0–37)
Albumin: 4.3 g/dL (ref 3.5–5.2)
Alkaline Phosphatase: 91 U/L (ref 39–117)
BUN: 14 mg/dL (ref 6–23)
CO2: 30 mEq/L (ref 19–32)
Calcium: 9.7 mg/dL (ref 8.4–10.5)
Chloride: 100 mEq/L (ref 96–112)
Creatinine, Ser: 1.12 mg/dL (ref 0.40–1.20)
GFR: 48.93 mL/min — ABNORMAL LOW (ref >60.00)
Glucose, Bld: 85 mg/dL (ref 70–99)
Potassium: 4 mEq/L (ref 3.5–5.1)
Sodium: 141 mEq/L (ref 135–145)
Total Bilirubin: 0.7 mg/dL (ref 0.2–1.2)
Total Protein: 7.4 g/dL (ref 6.0–8.3)

## 2021-02-22 LAB — TSH: TSH: 1.41 u[IU]/mL (ref 0.35–5.50)

## 2021-02-22 NOTE — Assessment & Plan Note (Signed)
Here for CPX HTN: BP today is very good, continue Maxide, potassium supplements, checking labs.  Encouraged to check ambulatory BPs Hyperlipidemia: On atorvastatin, checking labs Thyroid cancer, history of : again declines ultrasound to check the area. Aphonia: Seems a stable RTC 1 year

## 2021-02-22 NOTE — Assessment & Plan Note (Signed)
-  Tdap 01-2012 - s/p covid vacc x 3 rec booster -Declines any other vaccinations, benefits discussed. -Again declines colon, breast , osteoporosis or cervical cancer screening.   --Labs: CMP, CBC, TSH -Lifestyle: Trying to eat healthy, she is active at work. - POA discussed

## 2021-05-11 ENCOUNTER — Other Ambulatory Visit: Payer: Self-pay | Admitting: Internal Medicine

## 2021-07-13 NOTE — Progress Notes (Addendum)
Subjective:   [place] Cox is a 75 y.o. female who presents for an Initial Medicare Annual Wellness Visit.   I connected with [name] today by telephone and verified that I am speaking with the correct person using two identifiers. Location patient: home Location provider: work Persons participating in the virtual visit: patient, Marine scientist.    I discussed the limitations, risks, security and privacy concerns of performing an evaluation and management service by telephone and the availability of in person appointments. I also discussed with the patient that there may be a patient responsible charge related to this service. The patient expressed understanding and verbally consented to this telephonic visit.    Interactive audio and video telecommunications were attempted between this provider and patient, however failed, due to patient having technical difficulties OR patient did not have access to video capability.  We continued and completed visit with audio only.  Some vital signs may be absent or patient reported.   Time Spent with patient on telephone encounter: 20 minutes  Review of Systems     Cardiac Risk Factors include: advanced age (>60men, >15 women);hypertension;dyslipidemia     Objective:    Today's Vitals   07/14/21 0821  Weight: 158 lb (71.7 kg)  Height: 5\' 1"  (1.549 m)   Body mass index is 29.85 kg/m.  Advanced Directives 07/14/2021  Does Patient Have a Medical Advance Directive? No  Would patient like information on creating a medical advance directive? No - Patient declined    Current Medications (verified) Outpatient Encounter Medications as of 07/14/2021  Medication Sig   atorvastatin (LIPITOR) 40 MG tablet Take 1 tablet (40 mg total) by mouth at bedtime.   fexofenadine (ALLEGRA) 180 MG tablet Take 180 mg by mouth daily. OTC   fluticasone (FLONASE) 50 MCG/ACT nasal spray Place 2 sprays into the nose daily.   potassium chloride SA (KLOR-CON M20) 20  MEQ tablet Take 1 tablet (20 mEq total) by mouth daily.   triamterene-hydrochlorothiazide (MAXZIDE-25) 37.5-25 MG tablet TAKE 1 TABLET BY MOUTH EVERY DAY   No facility-administered encounter medications on file as of 07/14/2021.    Allergies (verified) Patient has no known allergies.   History: Past Medical History:  Diagnosis Date   Allergic rhinitis    Arthritis    Hyperlipidemia    Hypertension    Thyroid cancer ([place]) 1-11    R papillary microcarcinoma   Past Surgical History:  Procedure Laterality Date   CESAREAN SECTION     x 1   THYROIDECTOMY  1/11      - papillary microcarcinoma   TUBAL LIGATION     Family History  Problem Relation Age of Onset   Coronary artery disease Father        MI 61s   Hypertension Mother    Coronary artery disease Brother        MI 79   Diabetes Neg Hx    Colon cancer Neg Hx    Breast cancer Neg Hx    Stroke Neg Hx    Social History   Socioeconomic History   Marital status: Divorced    Spouse name: Not on file   Number of children: 3   Years of education: Not on file   Highest education level: Not on file  Occupational History   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 Resource Strain: Low Risk    Difficulty of Paying Living Expenses: Not hard at all  Food Insecurity: No Food Insecurity   Worried About Charity fundraiser in the Last Year: Never true   Arboriculturist in the Last Year: Never true  Transportation Needs: No  Transportation Needs   Lack of Transportation (Medical): No   Lack of Transportation (Non-Medical): No  Physical Activity: Inactive   Days of Exercise per Week: 0 days   Minutes of Exercise per Session: 0 min  Stress: No Stress Concern Present   Feeling of Stress : Not at all  Social Connections: Socially Isolated   Frequency of Communication with Friends and Family: More than three times a week   Frequency of Social Gatherings with Friends and Family: More than three times a week   Attends Religious Services: Never   Marine scientist or Organizations: No   Attends Music therapist: Never   Marital Status: Divorced    Tobacco Counseling Counseling given: Not Answered   Clinical Intake:  Pre-visit preparation completed: Yes        BMI - recorded: 29.85 Nutritional Status: BMI 25 -29 Overweight Nutritional Risks: None Diabetes: No  How often do you need to have someone help you when you read instructions, pamphlets, or other written materials from your doctor or pharmacy?: 1 - Never  Diabetic?No  Interpreter Needed?: No  Information entered by :: Caroleen Hamman LPN   Activities of Daily Living In your present state of health, do you have any difficulty performing the following activities: 07/14/2021 02/21/2021  Hearing? N N  Vision? N N  Difficulty concentrating or making decisions? N N  Walking or climbing stairs? N N  Dressing or bathing? N N  Doing errands, shopping? N N  Preparing Food and eating ? N -  Using the Toilet? N -  In the past six months, have you accidently leaked urine? N -  Do you have problems with loss of bowel control? N -  Managing your Medications? N -  Managing your Finances? N -  Housekeeping or managing your Housekeeping? N -  Some recent data might be hidden    Patient Care Team: Colon Branch, MD as PCP - General Coralie Keens, MD as Consulting Physician (General Surgery)  Indicate any recent Medical  Services you may have received from other than [facility] providers in the past year (date may be approximate).     Assessment:   This is a routine wellness examination for [place] Cox.  Hearing/Vision screen Hearing Screening - Comments:: No issues Vision Screening - Comments:: Last eye exam-several years ago  Dietary issues and exercise activities discussed: Current Exercise Habits: The patient does not participate in regular exercise at present, Exercise limited by: None identified   Goals Addressed             This Visit's Progress    Patient Stated       Maintain current health  Depression Screen PHQ 2/9 Scores 07/14/2021 08/23/2020 01/29/2020 01/28/2019 11/15/2017 11/09/2016 06/20/2015  PHQ - 2 Score 0 0 0 0 0 0 0  Exception Documentation - - - - - - Patient refusal    Fall Risk Fall Risk  07/14/2021 08/23/2020 01/29/2020 01/28/2019 11/15/2017  Falls in the past year? 0 0 0 0 No  Number falls in past yr: 0 0 0 0 -  Injury with Fall? 0 0 0 - -  Follow up Falls prevention discussed - Falls evaluation completed - -    FALL RISK PREVENTION PERTAINING TO THE HOME:  Any stairs in or around the home? No  Home free of loose throw rugs in walkways, pet beds, electrical cords, etc? Yes  Adequate lighting in your home to reduce risk of falls? Yes   ASSISTIVE DEVICES UTILIZED TO PREVENT FALLS:  Life alert? No  Use of a cane, walker or w/c? No  Grab bars in the bathroom? No  Shower chair or bench in shower? No  Elevated toilet seat or a handicapped toilet? No   TIMED UP AND GO:  Was the test performed? No . Phone visit   Cognitive Function:Normal cognitive status assessed by this Nurse Health Advisor. No abnormalities found.          Immunizations Immunization History  Administered Date(s) Administered   PFIZER(Purple Top)SARS-COV-2 Vaccination 09/19/2019, 10/10/2019, 05/14/2020   Tdap 01/28/2012    TDAP status: Up to date  Flu Vaccine status: Declined, Education has  been provided regarding the importance of this vaccine but patient still declined. Advised may receive this vaccine at local pharmacy or Health Dept. Aware to provide a copy of the vaccination record if obtained from local pharmacy or Health Dept. Verbalized acceptance and understanding.  Pneumococcal vaccine status: Declined,  Education has been provided regarding the importance of this vaccine but patient still declined. Advised may receive this vaccine at local pharmacy or Health Dept. Aware to provide a copy of the vaccination record if obtained from local pharmacy or Health Dept. Verbalized acceptance and understanding.   Covid-19 vaccine status: Information provided on how to obtain vaccines.   Qualifies for Shingles Vaccine? Yes   Zostavax completed No   Shingrix Completed?: No.    Education has been provided regarding the importance of this vaccine. Patient has been advised to call insurance company to determine out of pocket expense if they have not yet received this vaccine. Advised may also receive vaccine at local pharmacy or Health Dept. Verbalized acceptance and understanding.  Screening Tests Health Maintenance  Topic Date Due   Pneumonia Vaccine 18+ Years old (1 - PCV) Never done   COVID-19 Vaccine (4 - Booster for Pfizer series) 07/09/2020   MAMMOGRAM  08/22/2021 (Originally 01/20/1964)   DEXA SCAN  08/22/2021 (Originally 01/20/2013)   COLONOSCOPY (Pts 45-39yrs Insurance coverage will need to be confirmed)  08/22/2021 (Originally 01/20/1993)   INFLUENZA VACCINE  10/13/2021 (Originally 02/13/2021)   Zoster Vaccines- Shingrix (1 of 2) 02/21/2022 (Originally 01/21/1967)   TETANUS/TDAP  01/27/2022   Hepatitis C Screening  Completed   HPV VACCINES  Aged Out    Health Maintenance  Health Maintenance Due  Topic Date Due   Pneumonia Vaccine 10+ Years old (1 - PCV) Never done   COVID-19 Vaccine (4 - Booster for Pfizer series) 07/09/2020    Colorectal cancer screening:  Due-Declined  Mammogram status: Due-Declined  Bone Density status: Due-Declined  Lung Cancer Screening: (Low Dose CT Chest recommended if Age 63-80 years, 30 pack-year  currently smoking OR have quit w/in 15years.) does not qualify.     Additional Screening:  Hepatitis C Screening: Completed 06/27/2015  Vision Screening: Recommended annual ophthalmology exams for early detection of glaucoma and other disorders of the eye. Is the patient up to date with their annual eye exam?  No  Who is the provider or what is the name of the office in which the patient attends annual eye exams? Patient could not remember name-Plans to make an appt soon   Dental Screening: Recommended annual dental exams for proper oral hygiene  Community Resource Referral / Chronic Care Management: CRR required this visit?  No   CCM required this visit?  No      Plan:     I have personally reviewed and noted the following in the patients chart:   Medical and social history Use of alcohol, tobacco or illicit drugs  Current medications and supplements including opioid prescriptions. Patient is not currently taking opioid prescriptions. Functional ability and status Nutritional status Physical activity Advanced directives List of other physicians Hospitalizations, surgeries, and ER visits in previous 12 months Vitals Screenings to include cognitive, depression, and falls Referrals and appointments  In addition, I have reviewed and discussed with patient certain preventive protocols, quality metrics, and best practice recommendations. A written personalized care plan for preventive services as well as general preventive health recommendations were provided to patient.   Due to this being a telephonic visit, the after visit summary with patients personalized plan was offered to patient via mail or my-chart. Patient declined at this time.   Marta Antu, LPN   [id number]  Nurse Health  Advisor  Nurse Notes: None  I have reviewed and agree with Health Coaches documentation.  Kathlene November, MD

## 2021-07-14 ENCOUNTER — Ambulatory Visit (INDEPENDENT_AMBULATORY_CARE_PROVIDER_SITE_OTHER): Payer: Medicare Other

## 2021-07-14 VITALS — Ht 61.0 in | Wt 158.0 lb

## 2021-07-14 DIAGNOSIS — Z0000 Encounter for general adult medical examination without abnormal findings: Secondary | ICD-10-CM | POA: Diagnosis not present

## 2021-07-14 NOTE — Patient Instructions (Signed)
Virginia Cox , Thank you for taking time to complete your Medicare Wellness Visit. I appreciate your ongoing commitment to your health goals. Please review the following plan we discussed and let me know if I can assist you in the future.   Screening recommendations/referrals: Colonoscopy: Due-Declined Mammogram: Due-Declined Bone Density: Due-Declined Recommended yearly ophthalmology/optometry visit for glaucoma screening and checkup Recommended yearly dental visit for hygiene and checkup  Vaccinations: Influenza vaccine: Declined Pneumococcal vaccine: Declined Tdap vaccine: Up to date Shingles vaccine: Declined   Covid-19:Booster available at the pharmacy  Advanced directives: Declined information today  Conditions/risks identified: See problem list  Next appointment: Follow up in one year for your annual wellness visit    Preventive Care 65 Years and Older, Female Preventive care refers to lifestyle choices and visits with your health care provider that can promote health and wellness. What does preventive care include? A yearly physical exam. This is also called an annual well check. Dental exams once or twice a year. Routine eye exams. Ask your health care provider how often you should have your eyes checked. Personal lifestyle choices, including: Daily care of your teeth and gums. Regular physical activity. Eating a healthy diet. Avoiding tobacco and drug use. Limiting alcohol use. Practicing safe sex. Taking low-dose aspirin every day. Taking vitamin and mineral supplements as recommended by your health care provider. What happens during an annual well check? The services and screenings done by your health care provider during your annual well check will depend on your age, overall health, lifestyle risk factors, and family history of disease. Counseling  Your health care provider may ask you questions about your: Alcohol use. Tobacco use. Drug use. Emotional  well-being. Home and relationship well-being. Sexual activity. Eating habits. History of falls. Memory and ability to understand (cognition). Work and work Statistician. Reproductive health. Screening  You may have the following tests or measurements: Height, weight, and BMI. Blood pressure. Lipid and cholesterol levels. These may be checked every 5 years, or more frequently if you are over 32 years old. Skin check. Lung cancer screening. You may have this screening every year starting at age 73 if you have a 30-pack-year history of smoking and currently smoke or have quit within the past 15 years. Fecal occult blood test (FOBT) of the stool. You may have this test every year starting at age 3. Flexible sigmoidoscopy or colonoscopy. You may have a sigmoidoscopy every 5 years or a colonoscopy every 10 years starting at age 73. Hepatitis C blood test. Hepatitis B blood test. Sexually transmitted disease (STD) testing. Diabetes screening. This is done by checking your blood sugar (glucose) after you have not eaten for a while (fasting). You may have this done every 1-3 years. Bone density scan. This is done to screen for osteoporosis. You may have this done starting at age 73. Mammogram. This may be done every 1-2 years. Talk to your health care provider about how often you should have regular mammograms. Talk with your health care provider about your test results, treatment options, and if necessary, the need for more tests. Vaccines  Your health care provider may recommend certain vaccines, such as: Influenza vaccine. This is recommended every year. Tetanus, diphtheria, and acellular pertussis (Tdap, Td) vaccine. You may need a Td booster every 10 years. Zoster vaccine. You may need this after age 73. Pneumococcal 13-valent conjugate (PCV13) vaccine. One dose is recommended after age 73. Pneumococcal polysaccharide (PPSV23) vaccine. One dose is recommended after age 73. Talk to your  health care provider about which screenings and vaccines you need and how often you need them. This information is not intended to replace advice given to you by your health care provider. Make sure you discuss any questions you have with your health care provider. Document Released: 07/29/2015 Document Revised: 03/21/2016 Document Reviewed: 05/03/2015 Elsevier Interactive Patient Education  2017 [employer] Prevention in the Home Falls can cause injuries. They can happen to people of all ages. There are many things you can do to make your home safe and to help prevent falls. What can I do on the outside of my home? Regularly fix the edges of walkways and driveways and fix any cracks. Remove anything that might make you trip as you walk through a door, such as a raised step or threshold. Trim any bushes or trees on the path to your home. Use bright outdoor lighting. Clear any walking paths of anything that might make someone trip, such as rocks or tools. Regularly check to see if handrails are loose or broken. Make sure that both sides of any steps have handrails. Any raised decks and porches should have guardrails on the edges. Have any leaves, snow, or ice cleared regularly. Use sand or salt on walking paths during winter. Clean up any spills in your garage right away. This includes oil or grease spills. What can I do in the bathroom? Use night lights. Install grab bars by the toilet and in the tub and shower. Do not use towel bars as grab bars. Use non-skid mats or decals in the tub or shower. If you need to sit down in the shower, use a plastic, non-slip stool. Keep the floor dry. Clean up any water that spills on the floor as soon as it happens. Remove soap buildup in the tub or shower regularly. Attach bath mats securely with double-sided non-slip rug tape. Do not have throw rugs and other things on the floor that can make you trip. What can I do in the bedroom? Use night  lights. Make sure that you have a light by your bed that is easy to reach. Do not use any sheets or blankets that are too big for your bed. They should not hang down onto the floor. Have a firm chair that has side arms. You can use this for support while you get dressed. Do not have throw rugs and other things on the floor that can make you trip. What can I do in the kitchen? Clean up any spills right away. Avoid walking on wet floors. Keep items that you use a lot in easy-to-reach places. If you need to reach something above you, use a strong step stool that has a grab bar. Keep electrical cords out of the way. Do not use floor polish or wax that makes floors slippery. If you must use wax, use non-skid floor wax. Do not have throw rugs and other things on the floor that can make you trip. What can I do with my stairs? Do not leave any items on the stairs. Make sure that there are handrails on both sides of the stairs and use them. Fix handrails that are broken or loose. Make sure that handrails are as long as the stairways. Check any carpeting to make sure that it is firmly attached to the stairs. Fix any carpet that is loose or worn. Avoid having throw rugs at the top or bottom of the stairs. If you do have throw rugs, attach them to  the floor with carpet tape. Make sure that you have a light switch at the top of the stairs and the bottom of the stairs. If you do not have them, ask someone to add them for you. What else can I do to help prevent falls? Wear shoes that: Do not have high heels. Have rubber bottoms. Are comfortable and fit you well. Are closed at the toe. Do not wear sandals. If you use a stepladder: Make sure that it is fully opened. Do not climb a closed stepladder. Make sure that both sides of the stepladder are locked into place. Ask someone to hold it for you, if possible. Clearly mark and make sure that you can see: Any grab bars or handrails. First and last  steps. Where the edge of each step is. Use tools that help you move around (mobility aids) if they are needed. These include: Canes. Walkers. Scooters. Crutches. Turn on the lights when you go into a dark area. Replace any light bulbs as soon as they burn out. Set up your furniture so you have a clear path. Avoid moving your furniture around. If any of your floors are uneven, fix them. If there are any pets around you, be aware of where they are. Review your medicines with your doctor. Some medicines can make you feel dizzy. This can increase your chance of falling. Ask your doctor what other things that you can do to help prevent falls. This information is not intended to replace advice given to you by your health care provider. Make sure you discuss any questions you have with your health care provider. Document Released: 04/28/2009 Document Revised: 12/08/2015 Document Reviewed: 08/06/2014 Elsevier Interactive Patient Education  2017 [employer].

## 2021-08-06 ENCOUNTER — Ambulatory Visit
Admission: EM | Admit: 2021-08-06 | Discharge: 2021-08-06 | Disposition: A | Discharge status: Other Acute Inpatient Hospital | Payer: Medicare Other | Attending: Physician Assistant | Admitting: Physician Assistant

## 2021-08-06 ENCOUNTER — Emergency Department (HOSPITAL_BASED_OUTPATIENT_CLINIC_OR_DEPARTMENT_OTHER)
Admission: EM | Admit: 2021-08-06 | Discharge: 2021-08-06 | Disposition: A | Discharge status: Home / Self Care | Payer: Medicare Other | Attending: Emergency Medicine | Admitting: Emergency Medicine

## 2021-08-06 ENCOUNTER — Encounter: Payer: Self-pay | Admitting: Emergency Medicine

## 2021-08-06 ENCOUNTER — Encounter (HOSPITAL_BASED_OUTPATIENT_CLINIC_OR_DEPARTMENT_OTHER): Payer: Self-pay | Admitting: *Deleted

## 2021-08-06 ENCOUNTER — Other Ambulatory Visit: Payer: Self-pay

## 2021-08-06 DIAGNOSIS — Z79899 Other long term (current) drug therapy: Secondary | ICD-10-CM | POA: Diagnosis not present

## 2021-08-06 DIAGNOSIS — R059 Cough, unspecified: Secondary | ICD-10-CM | POA: Insufficient documentation

## 2021-08-06 DIAGNOSIS — R42 Dizziness and giddiness: Secondary | ICD-10-CM

## 2021-08-06 DIAGNOSIS — I10 Essential (primary) hypertension: Secondary | ICD-10-CM

## 2021-08-06 DIAGNOSIS — R030 Elevated blood-pressure reading, without diagnosis of hypertension: Secondary | ICD-10-CM | POA: Diagnosis present

## 2021-08-06 DIAGNOSIS — Z20822 Contact with and (suspected) exposure to covid-19: Secondary | ICD-10-CM | POA: Diagnosis not present

## 2021-08-06 LAB — BASIC METABOLIC PANEL
Anion gap: 11 (ref 5–15)
BUN: 14 mg/dL (ref 8–23)
CO2: 26 mmol/L (ref 22–32)
Calcium: 9.2 mg/dL (ref 8.9–10.3)
Chloride: 103 mmol/L (ref 98–111)
Creatinine, Ser: 0.91 mg/dL (ref 0.44–1.00)
GFR, Estimated: >60 mL/min (ref >60)
Glucose, Bld: 101 mg/dL — ABNORMAL HIGH (ref 70–99)
Potassium: 3.5 mmol/L (ref 3.5–5.1)
Sodium: 140 mmol/L (ref 135–145)

## 2021-08-06 LAB — URINALYSIS, ROUTINE W REFLEX MICROSCOPIC
Bilirubin Urine: NEGATIVE
Glucose, UA: NEGATIVE mg/dL
Ketones, ur: NEGATIVE mg/dL
Leukocytes,Ua: NEGATIVE
Nitrite: NEGATIVE
Protein, ur: NEGATIVE mg/dL
Specific Gravity, Urine: 1.01 (ref 1.005–1.030)
pH: 6.5 (ref 5.0–8.0)

## 2021-08-06 LAB — URINALYSIS, MICROSCOPIC (REFLEX)
Squamous Epithelial / HPF: NONE SEEN (ref 0–5)
WBC, UA: NONE SEEN WBC/hpf (ref 0–5)

## 2021-08-06 LAB — RESP PANEL BY RT-PCR (FLU A&B, COVID) ARPGX2
Influenza A by PCR: NEGATIVE
Influenza B by PCR: NEGATIVE
SARS Coronavirus 2 by RT PCR: NEGATIVE

## 2021-08-06 LAB — CBC
HCT: 44.9 % (ref 36.0–46.0)
Hemoglobin: 15.2 g/dL — ABNORMAL HIGH (ref 12.0–15.0)
MCH: 31.3 pg (ref 26.0–34.0)
MCHC: 33.9 g/dL (ref 30.0–36.0)
MCV: 92.4 fL (ref 80.0–100.0)
Platelets: 477 10*3/uL — ABNORMAL HIGH (ref 150–400)
RBC: 4.86 MIL/uL (ref 3.87–5.11)
RDW: 12.8 % (ref 11.5–15.5)
WBC: 12 10*3/uL — ABNORMAL HIGH (ref 4.0–10.5)
nRBC: 0 % (ref 0.0–0.2)

## 2021-08-06 LAB — TROPONIN I (HIGH SENSITIVITY): Troponin I (High Sensitivity): 3 ng/L (ref <18)

## 2021-08-06 NOTE — ED Triage Notes (Signed)
Pt sts dizziness and feeling lightheaded x 2 days; pt took BP today and noted was elevated; pt takes meds for htn

## 2021-08-06 NOTE — ED Provider Notes (Signed)
[facility]    CSN: [id number] Arrival date & time: 08/06/21  1451      History   Chief Complaint Chief Complaint  Patient presents with   Hypertension        Dizziness    HPI Virginia Cox is a 76 y.o. female.   Patient here today for evaluation of elevated blood pressure, dizziness and lightheadedness that started 2 days ago.  She states that she took her blood pressure today and it was noted to be elevated for her.  She does take medication for high blood pressure.  She has not had any chest pain.  She did have headache yesterday but none today.  She has not had any numbness or tingling.  The history is provided by the patient.  Hypertension Pertinent negatives include no chest pain and no shortness of breath.  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      Home Medications    Prior to Admission medications   Medication Sig Start Date End Date Taking? Authorizing Provider  atorvastatin (LIPITOR) 40 MG tablet Take 1 tablet (40 mg total) by mouth at bedtime. 02/22/21   Colon Branch, MD  fexofenadine (ALLEGRA) 180 MG tablet Take 180 mg by mouth daily. OTC     [provider]  fluticasone (FLONASE) 50 MCG/ACT nasal spray Place 2 sprays into the nose daily.    [provider]  potassium chloride SA (KLOR-CON M20) 20 MEQ tablet Take 1 tablet (20 mEq total) by mouth daily. 02/13/21   Colon Branch, MD  triamterene-hydrochlorothiazide [facility]) 37.5-25 MG tablet TAKE 1 TABLET BY MOUTH EVERY DAY 05/11/21   Colon Branch, MD    Family History Family History  Problem Relation Age of Onset   Coronary artery disease Father        MI 21s   Hypertension Mother    Coronary artery disease Brother        MI 18   Diabetes Neg Hx    Colon cancer Neg Hx    Breast cancer Neg Hx    Stroke Neg Hx     Social History Social History   Tobacco Use   Smoking status: Never   Smokeless tobacco: Never  Substance Use Topics   Alcohol use: No   Drug use: No     Allergies   Patient has no known allergies.   Review of Systems Review of Systems  Constitutional:  Negative for chills and fever.  Eyes:  Negative for discharge and redness.  Respiratory:  Negative for shortness of  breath.   Cardiovascular:  Negative for chest pain.  Neurological:  Positive for dizziness and light-headedness. Negative for weakness and numbness.    Physical Exam Triage Vital Signs ED Triage Vitals  Enc Vitals Group     BP 08/06/21 1533 (!) 178/83     Pulse Rate 08/06/21 1533 71     Resp 08/06/21 1533 18     Temp 08/06/21 1533 98.1 F (36.7 C)     Temp Source 08/06/21 1533 Oral     SpO2 08/06/21 1533 96 %     Weight --      Height --      Head Circumference --      Peak Flow --      Pain Score 08/06/21 1534 0     Pain Loc --      Pain Edu? --      Excl. in [place]? --    No data found.  Updated Vital Signs BP (!) 178/83 (BP Location: Left Arm)    Pulse 71    Temp 98.1 F (36.7 C) (Oral)    Resp 18    SpO2 96%      Physical Exam Vitals and nursing note reviewed.  Constitutional:      General: She is not in acute distress.    Appearance: Normal  appearance. She is not ill-appearing.  HENT:     Head: Normocephalic and atraumatic.  Eyes:     Conjunctiva/sclera: Conjunctivae normal.  Cardiovascular:     Rate and Rhythm: Normal rate and regular rhythm.  Pulmonary:     Effort: Pulmonary effort is normal. No respiratory distress.     Breath sounds: Normal breath sounds. No wheezing, rhonchi or rales.  Neurological:     Mental Status: She is alert.  Psychiatric:        Mood and Affect: Mood normal.        Behavior: Behavior normal.     UC Treatments / Results  Labs (all labs ordered are listed, but only abnormal results are displayed) Labs Reviewed - No data to display  EKG   Radiology No results found.  Procedures Procedures (including critical care time)  Medications Ordered in UC Medications - No data to display  Initial Impression / Assessment and Plan / UC Course  I have reviewed the triage vital signs and the nursing notes.  Pertinent labs & imaging results that were available during my care of the patient were reviewed by me and considered in my medical decision making (see chart for details).    BP is elevated compared to recent vitals.  Given dizziness, advanced age, and known hypertension recommended further evaluation in the ED for labs.  EKG with minimal changes compared to previous.  Patient is agreeable for ED evaluation and has family member here to drive her.  Final Clinical Impressions(s) / UC Diagnoses   Final diagnoses:  Primary hypertension  Dizziness   Discharge Instructions   None    ED Prescriptions   None    PDMP not reviewed this encounter.   Francene Finders, PA-C 08/06/21 1552

## 2021-08-06 NOTE — ED Provider Notes (Signed)
[facility] Provider Note   CSN: [id number] Arrival date & time: 08/06/21  1609     History  Chief Complaint  Patient presents with   Hypertension    Virginia Cox is a 76 y.o. female with Pmhx HTN who presents to the ED today from UC for elevated blood pressure.  Patient endorses that for the past 2 days she has felt lightheaded and dizzy.  She states that she also was having some sinus issues.  She decided to check her blood pressure and states that it was elevated.  She went to urgent care today and had an EKG done and was advised to come to the ED for further evaluation due to age and high blood pressure.  Per chart review she was complaining of a headache yesterday however now she is not sure.  She states that she thinks it may have been her sinuses.  Her daughter is at bedside and states she was having headache earlier as well this week.  Patient states that she did have a cough yesterday however has not had 1 today.  She denies any specific fevers or chills or body aches.  She does admit that she does not check her blood pressure regularly and is unsure what her baseline is.  She does endorse that she has been on her triamterene HCTZ for approximately 20 years without any dose adjustments.  She denies any specific chest pain, shortness of breath, difficulty urinating.   The history is provided by the patient and medical records.      Home Medications Prior to Admission medications   Medication Sig Start Date End Date Taking? Authorizing Provider  atorvastatin (LIPITOR) 40 MG tablet Take 1 tablet (40 mg total) by mouth at bedtime. 02/22/21   Colon Branch, MD  fexofenadine (ALLEGRA) 180 MG tablet Take 180 mg by mouth daily. OTC    [provider]  fluticasone (FLONASE) 50 MCG/ACT nasal spray Place 2 sprays into the nose daily.    [provider]  potassium chloride SA (KLOR-CON M20) 20 MEQ tablet Take 1 tablet (20 mEq total) by  mouth daily. 02/13/21   Colon Branch, MD  triamterene-hydrochlorothiazide [facility]) 37.5-25 MG tablet TAKE 1 TABLET BY MOUTH EVERY DAY 05/11/21   Colon Branch, MD      Allergies    Patient has no known allergies.    Review of Systems   Review of Systems  Constitutional:  Negative for chills and fever.  HENT:  Positive for sinus pressure.   Eyes:  Negative for visual disturbance.  Respiratory:  Negative for shortness of breath.   Cardiovascular:  Negative for chest pain.  Gastrointestinal:  Negative for abdominal pain, nausea and vomiting.  Neurological:  Positive for dizziness and light-headedness.  All other systems reviewed and are negative.  Physical Exam Updated Vital Signs BP (!) 166/89 (BP Location: Left Arm)    Pulse 71    Temp 98.6 F (37 C) (Oral)    Resp 16    Ht 5\' 1"  (1.549 m)    Wt 70.9 kg    SpO2 98%    BMI 29.53 kg/m  Physical Exam Vitals and nursing note reviewed.  Constitutional:      Appearance: She is not ill-appearing or diaphoretic.  HENT:     Head: Normocephalic and atraumatic.     Right Ear: Tympanic membrane normal.     Left Ear: Tympanic membrane normal.  Eyes:     Conjunctiva/sclera: Conjunctivae  normal.  Cardiovascular:     Rate and Rhythm: Normal rate and regular rhythm.     Pulses: Normal pulses.  Pulmonary:     Effort: Pulmonary effort is normal.     Breath sounds: Normal breath sounds. No wheezing, rhonchi or rales.  Abdominal:     Palpations: Abdomen is soft.     Tenderness: There is no abdominal tenderness.  Musculoskeletal:     Cervical back: Neck supple.  Skin:    General: Skin is warm and dry.  Neurological:     Mental Status: She is alert.     Comments: Alert and oriented to self, place, time and event.   Speech is fluent, clear without dysarthria or dysphasia.   Strength 5/5 in upper/lower extremities   Sensation intact in upper/lower extremities   Normal gait.  Negative Romberg. No pronator drift.  Normal finger-to-nose  and feet tapping.  CN I not tested  CN II grossly intact visual fields bilaterally. Did not visualize posterior eye.  CN III, IV, VI PERRLA and EOMs intact bilaterally  CN V Intact sensation to sharp and light touch to the face  CN VII facial movements symmetric  CN VIII not tested  CN IX, X no uvula deviation, symmetric rise of soft palate  CN XI 5/5 SCM and trapezius strength bilaterally  CN XII Midline tongue protrusion, symmetric L/R movements      ED Results / Procedures / Treatments   Labs (all labs ordered are listed, but only abnormal results are displayed) Labs Reviewed  BASIC METABOLIC PANEL - Abnormal; Notable for the following components:      Result Value   Glucose, Bld 101 (*)    All other components within normal limits  CBC - Abnormal; Notable for the following components:   WBC 12.0 (*)    Hemoglobin 15.2 (*)    Platelets 477 (*)    All other components within normal limits  URINALYSIS, ROUTINE W REFLEX MICROSCOPIC - Abnormal; Notable for the following components:   Color, Urine STRAW (*)    Hgb urine dipstick TRACE (*)    All other components within normal limits  URINALYSIS, MICROSCOPIC (REFLEX) - Abnormal; Notable for the following components:   Bacteria, UA RARE (*)    All other components within normal limits  RESP PANEL BY RT-PCR (FLU A&B, COVID) [id number]  TROPONIN I (HIGH SENSITIVITY)    EKG None  Radiology No results found.  Procedures Procedures    Medications Ordered in ED Medications - No data to display  ED Course/ Medical Decision Making/ A&P                           Medical Decision Making 76 year old female who presents to the ED today from urgent care for high blood pressure.  Has been feeling dizzy/lightheaded for the past 2 days with associated sinus pressure.  Her blood pressure at home and it was elevated.  On arrival to the ED today blood pressure 166/89.  Repeat 171/86.  Remainder vitals unremarkable.  On my exam she is  resting comfortably and appears to be no acute distress.  She has no focal neurodeficits on exam today.  Patient had lab work collected while in the waiting room including CBC, BMP, troponin.  Given her symptoms we will plan for COVID test as well to rule out.  Question if patient's blood pressure is always elevated because she does admit that she does not check it frequently  and is not sure regarding her baseline.  Problems Addressed: Primary hypertension: acute illness or injury    Details: Most reading blood pressure reading in the ED 140/86 without any interventions. Remainder of workup reassuringin the ED today. Will discharge home with PCP follow up in the morning. Pt instructed to keep a log of her daily readings to take with to PCP's office. She is instructed to return to the ED for any new/worsening symptoms including headache, vision changes, unilateral weakness or numbness, speech changes, facial droop, chest pain, SOB, difficulty urinating, or any other new/concerning symptoms.  Amount and/or Complexity of Data Reviewed Labs: ordered.    Details: CBC with mild leukocytosis today at 12,000.  Hemoglobin slightly elevated at 15.2.  Platelets are also slightly elevated at 477.  No other abnormalities.  Question mild dehydration. BMP with a glucose 101.  No other electrolyte abnormalities. Troponin of 3. U/A without signs of infection  COVID and flu negative          Final Clinical Impression(s) / ED Diagnoses Final diagnoses:  Primary hypertension    Rx / DC Orders ED Discharge Orders     None        Discharge Instructions      Please follow up with your PCP in the next 1-2 days for further evaluation of your high blood pressure  Continue taking your blood pressure medication as prescribed. It is recommended that you keep a daily log of your blood pressure readings to take with to your PCP appointment  Return to the ED IMMEDIATELY for any new/worsening symptoms  including headache, vision changes, unilateral weakness or numbness, speech changes, facial droop, chest pain, shortness of breath, difficulty urinating, or any other new/concerning symptoms.        Eustaquio Maize, PA-C 71/24/58 0998    Lianne Cure, DO 33/82/50 1123

## 2021-08-06 NOTE — Discharge Instructions (Signed)
Please follow up with your PCP in the next 1-2 days for further evaluation of your high blood pressure  Continue taking your blood pressure medication as prescribed. It is recommended that you keep a daily log of your blood pressure readings to take with to your PCP appointment  Return to the ED IMMEDIATELY for any new/worsening symptoms including headache, vision changes, unilateral weakness or numbness, speech changes, facial droop, chest pain, shortness of breath, difficulty urinating, or any other new/concerning symptoms.

## 2021-08-06 NOTE — ED Triage Notes (Signed)
Pt reports feeling dizzy and lightheaded since Friday. She checked her BP today and it was 167/90. She went to Urgent Care and BP was 178/83. She was advised to come to ED for further evaluation and labs. She states she feels lightheaded when she stands up

## 2021-08-07 ENCOUNTER — Encounter: Payer: Self-pay | Admitting: Internal Medicine

## 2021-08-07 ENCOUNTER — Ambulatory Visit (INDEPENDENT_AMBULATORY_CARE_PROVIDER_SITE_OTHER): Payer: Medicare Other | Admitting: Internal Medicine

## 2021-08-07 ENCOUNTER — Ambulatory Visit (HOSPITAL_BASED_OUTPATIENT_CLINIC_OR_DEPARTMENT_OTHER)
Admission: RE | Admit: 2021-08-07 | Discharge: 2021-08-07 | Disposition: A | Discharge status: Home / Self Care | Payer: Medicare Other | Source: Ambulatory Visit | Attending: Internal Medicine | Admitting: Internal Medicine

## 2021-08-07 VITALS — BP 142/90 | HR 65 | Temp 98.6°F | Resp 16 | Ht 61.0 in | Wt 154.4 lb

## 2021-08-07 DIAGNOSIS — I10 Essential (primary) hypertension: Secondary | ICD-10-CM | POA: Diagnosis not present

## 2021-08-07 DIAGNOSIS — R42 Dizziness and giddiness: Secondary | ICD-10-CM

## 2021-08-07 MED ORDER — AMLODIPINE BESYLATE 5 MG PO TABS: 5.0000 mg | ORAL_TABLET | Freq: Every day | ORAL | 6 refills | Status: DC

## 2021-08-07 NOTE — Patient Instructions (Signed)
Go to the first floor and get a CAT scan of your head  Continue the same medication, add amlodipine 5 mg 1 tablet at bedtime to improve your blood pressure.  Check the  blood pressure regularly BP GOAL is between 110/65 and  135/85. If it is consistently higher or lower, let me know  If you have severe symptoms: Increased dizziness, major headache, chest pain, palpitations: Seek medical attention   GO TO THE FRONT DESK, PLEASE SCHEDULE YOUR APPOINTMENTS Come back for   a checkup in 4 weeks

## 2021-08-07 NOTE — Assessment & Plan Note (Signed)
Dizziness, headache (?),  Neck stiffness (?): Has multiple symptoms, all very vague, physical exam is benign. BP was indeed elevated prior to going to the emergency room and remains somewhat elevated. Currently on Maxide, potassium. Plan:  Add amlodipine 5 mg daily, watch salt intake CT head today Seek medical attention if severe symptoms. HTN: As above RTC 1 month.

## 2021-08-07 NOTE — Progress Notes (Signed)
Subjective:    Patient ID: Virginia Cox, female    DOB: 10-Dec-1945, 76 y.o.   MRN: [id number]  DOS:  08/07/2021 Type of visit - description: Acute  Started to experience a number of symptoms since 08/05/2020: Dizziness, lightheaded, spinning.  No associated nausea or vomiting. Headache?  "My daughter said I complain of a headache, I am not sure". Neck stiffness?  "I think so". Because of all the above, she started to check her BP at home it was elevated ranging from the 140 -170s/80-90.  Went to the urgent care and subsequently to the ED yesterday. Work-up: BMP, CBC, troponin, respiratory panel, UA: All essentially okay except for a WBC of 12.0, slightly increased hemoglobin and platelets EKG: NSR, no acute changes, no major changes compared to EKG from 2012 She is here for follow-up. Because symptoms were vague asked for further description, she states she feels jittery, has "brain fog", "hard to concentrate".  Specifically denies fever chills No chest pain, shortness of breath or palpitations No blood in the stools Did have some mild cough and sneezing. Specifically denies diplopia, slurred speech or motor deficits.  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    Past Surgical History:  Procedure Laterality Date   CESAREAN SECTION     x 1   THYROIDECTOMY  1/11      - papillary microcarcinoma   TUBAL LIGATION      Current Outpatient Medications  Medication Instructions   atorvastatin (LIPITOR) 40 mg, Oral, Daily at bedtime   fexofenadine (ALLEGRA) 180 mg, Oral, Daily, OTC    fluticasone (FLONASE) 50 MCG/ACT nasal spray 2 sprays, Daily   potassium chloride SA (KLOR-CON M20) 20 MEQ tablet 20 mEq, Oral, Daily   triamterene-hydrochlorothiazide (MAXZIDE-25) 37.5-25 MG tablet TAKE 1 TABLET BY MOUTH EVERY DAY        Objective:   Physical Exam BP (!) 142/90 (BP Location: Left Arm, Patient Position: Sitting, Cuff Size: Small)    Pulse 65    Temp 98.6 F (37 C) (Oral)    Resp 16    Ht 5\' 1"  (1.549 m)    Wt 154 lb 6 oz (70 kg)    SpO2 97%    BMI 29.17 kg/m  General:   Well developed, NAD, BMI noted.  HEENT:  Normocephalic . Face symmetric, atraumatic Neck: Normal carotid pulses Lungs:  CTA B Normal respiratory effort, no intercostal retractions, no accessory muscle use. Heart: RRR,  no murmur.  Abdomen:  Not distended, soft, non-tender. No rebound or rigidity.   Skin: Not pale. Not jaundice Lower extremities: no pretibial edema bilaterally  Neurologic:  alert & oriented X3.  Speech normal, gait appropriate for age and unassisted. EOMI, face symmetric, tongue midline. Some tremors noted (hands, not a new finding) Psych--  Cognition and judgment appear intact.  Cooperative with normal attention span and concentration.  Behavior appropriate. No anxious or depressed appearing.     Assessment       Assessment  HTN Hyperlipidemia Thyroid cancer 2011, right-sided papillary microcarcinoma, R thyroidectomy   Aphonia, from essential tremor?Marland Kitchen  PLAN: Dizziness, headache (?),  Neck stiffness (?): Has multiple symptoms, all very vague, physical exam is benign. BP was indeed elevated prior to going to the emergency room and remains somewhat elevated. Currently on Maxide, potassium.  Plan:  Add amlodipine 5 mg daily, watch salt intake CT head today Seek medical attention if severe symptoms. HTN: As above RTC 1 month.    This visit occurred during the SARS-CoV-2 public health emergency.  Safety protocols were in place, including screening questions prior to the visit, additional usage of staff PPE, and extensive cleaning of exam room while observing appropriate contact time as indicated for disinfecting solutions.

## 2021-08-08 ENCOUNTER — Other Ambulatory Visit: Payer: Self-pay | Admitting: Internal Medicine

## 2021-09-04 ENCOUNTER — Ambulatory Visit (INDEPENDENT_AMBULATORY_CARE_PROVIDER_SITE_OTHER): Payer: Medicare Other | Admitting: Internal Medicine

## 2021-09-04 ENCOUNTER — Encounter: Payer: Self-pay | Admitting: Internal Medicine

## 2021-09-04 VITALS — BP 132/80 | HR 78 | Temp 97.8°F | Resp 18 | Ht 61.0 in | Wt 157.4 lb

## 2021-09-04 DIAGNOSIS — R42 Dizziness and giddiness: Secondary | ICD-10-CM | POA: Diagnosis not present

## 2021-09-04 DIAGNOSIS — I10 Essential (primary) hypertension: Secondary | ICD-10-CM | POA: Diagnosis not present

## 2021-09-04 DIAGNOSIS — J069 Acute upper respiratory infection, unspecified: Secondary | ICD-10-CM

## 2021-09-04 MED ORDER — AMOXICILLIN 875 MG PO TABS: 875.0000 mg | ORAL_TABLET | Freq: Two times a day (BID) | ORAL | 0 refills | Status: DC

## 2021-09-04 NOTE — Progress Notes (Signed)
° °  Subjective:    Patient ID: Virginia Cox, female    DOB: 1945/10/14, 76 y.o.   MRN: [id number]  DOS:  09/04/2021 Type of visit - description: Follow-up  This is a follow-up from the previous visit, she had a number of symptoms: Headache: Resolved Stiff neck: Resolved Brain fog and lack of concentration: Resolved Jittery: Still feel a little jittery.  Also has developed head cold for the last 3 weeks. Saw nasal congestion, frontal headache, mild cough. Nasal discharge is mostly clear.   Review of Systems See above   Past Medical History:  Diagnosis Date   Allergic rhinitis    Arthritis    Hyperlipidemia    Hypertension    Thyroid cancer ([place]) 1-11    R papillary microcarcinoma    Past Surgical History:  Procedure Laterality Date   CESAREAN SECTION     x 1   THYROIDECTOMY  1/11      - papillary microcarcinoma   TUBAL LIGATION      Current Outpatient Medications  Medication Instructions   amLODipine (NORVASC) 5 mg, Oral, Daily   atorvastatin (LIPITOR) 40 mg, Oral, Daily at bedtime   fexofenadine (ALLEGRA) 180 mg, Oral, Daily, OTC    fluticasone (FLONASE) 50 MCG/ACT nasal spray 2 sprays, Daily   KLOR-CON M20 20 MEQ tablet TAKE 1 TABLET BY MOUTH EVERY DAY   triamterene-hydrochlorothiazide (MAXZIDE-25) 37.5-25 MG tablet TAKE 1 TABLET BY MOUTH EVERY DAY       Objective:   Physical Exam BP 132/80 (BP Location: Left Arm, Patient Position: Sitting, Cuff Size: Small)    Pulse 78    Temp 97.8 F (36.6 C) (Oral)    Resp 18    Ht 5\' 1"  (1.549 m)    Wt 157 lb 6 oz (71.4 kg)    SpO2 97%    BMI 29.74 kg/m  General:   Well developed, NAD, BMI noted. HEENT:  Normocephalic . Face symmetric, atraumatic.  Nose is not congested, sinuses non-TTP.  TMs normal.  Throat symmetric and not red Lungs:  CTA B Normal respiratory effort, no intercostal retractions, no accessory muscle use. Heart: RRR,  no murmur.  Lower extremities: no pretibial edema bilaterally  Skin: Not  pale. Not jaundice Neurologic:  alert & oriented X3.  Speech normal, gait appropriate for age and unassisted Psych--  Cognition and judgment appear intact.  Cooperative with normal attention span and concentration.  Behavior appropriate. No anxious or depressed appearing.      Assessment    Assessment  HTN Hyperlipidemia Thyroid cancer 2011, right-sided papillary microcarcinoma, R thyroidectomy   Aphonia, from essential tremor?Marland Kitchen  PLAN: Dizziness, headache, neck stiffness: At the last visit she complained of multiple symptoms, most of them are  actually better.  CT of the head was normal.  No further evaluation needed at this time. HTN: BP was elevated, amlodipine was added few weeks ago, she also takes Maxide and potassium.  BP here and in the ambulatory setting are great.  No change URI: "Head cold" symptoms for 3 weeks.  Exam is benign.  Mild sinusitis?  Request antibiotics.  Plan: Flonase, Ronny Bacon, amoxicillin, call if not gradually better RTC August 2023 CPX.  Sooner if needed     This visit occurred during the SARS-CoV-2 public health emergency.  Safety protocols were in place, including screening questions prior to the visit, additional usage of staff PPE, and extensive cleaning of exam room while observing appropriate contact time as indicated for disinfecting solutions.

## 2021-09-04 NOTE — Patient Instructions (Signed)
° ° °  For nasal congestion: -Use over-the-counter Flonase: 2 nasal sprays on each side of the nose in the morning until you feel better  -Use OTC Astepro 2 nasal sprays on each side of the nose twice daily until better  Avoid decongestants such as  Pseudoephedrine or phenylephrine     Take the antibiotic as prescribed (amoxicillin)  Call if not gradually better over the next  10 days

## 2021-09-05 NOTE — Assessment & Plan Note (Signed)
Dizziness, headache, neck stiffness: At the last visit she complained of multiple symptoms, most of them are  actually better.  CT of the head was normal.  No further evaluation needed at this time. HTN: BP was elevated, amlodipine was added few weeks ago, she also takes Maxide and potassium.  BP here and in the ambulatory setting are great.  No change URI: "Head cold" symptoms for 3 weeks.  Exam is benign.  Mild sinusitis?  Request antibiotics.  Plan: Flonase, Ronny Bacon, amoxicillin, call if not gradually better RTC August 2023 CPX.  Sooner if needed

## 2022-01-27 ENCOUNTER — Other Ambulatory Visit: Payer: Self-pay | Admitting: Internal Medicine

## 2022-02-15 ENCOUNTER — Other Ambulatory Visit: Payer: Self-pay | Admitting: Internal Medicine

## 2022-02-23 ENCOUNTER — Encounter: Payer: Self-pay | Admitting: Internal Medicine

## 2022-02-23 ENCOUNTER — Ambulatory Visit (INDEPENDENT_AMBULATORY_CARE_PROVIDER_SITE_OTHER): Payer: Medicare Other | Admitting: Internal Medicine

## 2022-02-23 VITALS — BP 112/70 | HR 78 | Temp 98.5°F | Ht 61.0 in | Wt 156.8 lb

## 2022-02-23 DIAGNOSIS — Z0000 Encounter for general adult medical examination without abnormal findings: Secondary | ICD-10-CM

## 2022-02-23 DIAGNOSIS — E785 Hyperlipidemia, unspecified: Secondary | ICD-10-CM | POA: Diagnosis not present

## 2022-02-23 DIAGNOSIS — I10 Essential (primary) hypertension: Secondary | ICD-10-CM

## 2022-02-23 NOTE — Patient Instructions (Signed)
Check the  blood pressure regularly BP GOAL is between 110/65 and  135/85. If it is consistently higher or lower, let me know  Start taking vitamin D3 over-the-counter: 2000 units every day  GO TO THE LAB : Get the blood work     Center, [place] back for a checkup in 6 months     "Living will", "[place] of attorney": Advanced care planning  (If you already have a living will or healthcare power of attorney, please bring the copy to be scanned in your chart.)  Advance care planning is a process that supports adults in  understanding and sharing their preferences regarding future medical care.   The patient's preferences are recorded in documents called Advance Directives.    Advanced directives are completed (and can be modified at any time) while the patient is in full mental capacity.   The documentation should be available at all times to the patient, the family and the healthcare providers.  Bring in a copy to be scanned in your chart is an excellent idea and is recommended   This legal documents direct treatment decision making and/or appoint a surrogate to make the decision if the patient is not capable to do so.    Advance directives can be documented in many types of formats,  documents have names such as:  Lliving will  Durable power of attorney for healthcare (healthcare proxy or healthcare power of attorney)  Combined directives  Physician orders for life-sustaining treatment    More information at:  [email or web address]

## 2022-02-23 NOTE — Progress Notes (Unsigned)
Subjective:    Patient ID: Virginia Cox, female    DOB: [birth year], 76 y.o.   MRN: [id number]  DOS:  02/23/2022 Type of visit - description: CPX  Here for CPX. Feeling well and has no new concerns. Good med compliance.   Review of Systems  Other than above, a 14 point review of systems is negative      Past Medical History:  Diagnosis Date   Allergic rhinitis    Arthritis    Hyperlipidemia    Hypertension    Thyroid cancer ([place]) 1-11    R papillary microcarcinoma    Past Surgical History:  Procedure Laterality Date   CESAREAN SECTION     x 1   THYROIDECTOMY  1/11      - papillary microcarcinoma   TUBAL LIGATION     Social History   Socioeconomic History   Marital status: Divorced    Spouse name: Not on file   Number of children: 3   Years of education: Not on file   Highest education level: Not on file  Occupational History   Occupation: retired  Agricultural consultant    Occupation: works at [employer] part -time  Tobacco Use   Smoking status: Never   Smokeless tobacco: Never  Substance and Sexual Activity   Alcohol use: No   Drug use: No   Sexual activity: Not on file  Other Topics Concern   Not on file  Social History Narrative   divorced , lives w/ Vincente Liberty midle daughter   3 kids , lost mother 09-2017, she was 51 y/o, lived w/ her mom all her life   1 daughter lives w/ her   1 daughter in [place]   1 daughter in [place] Strain: [place]  (07/14/2021)   Overall Financial Resource Strain (CARDIA)    Difficulty of Paying Living Expenses: Not hard at all  Food Insecurity: No [place] (07/14/2021)   Hunger Vital Sign    Worried About Running Out of Food in the Last Year: Never true    [place] in the Last Year: Never true  Transportation Needs: No Transportation Needs (07/14/2021)   PRAPARE - Hydrologist (Medical): No    Lack of Transportation  (Non-Medical): No  Physical Activity: Inactive (07/14/2021)   Exercise Vital Sign    Days of Exercise per Week: 0 days    Minutes of Exercise per Session: 0 min  Stress: No Stress Concern Present (07/14/2021)   [place]    Feeling of Stress : Not at all  Social Connections: Socially Isolated (07/14/2021)   Social Connection and Isolation Panel [NHANES]    Frequency of Communication with Friends and Family: More than three times a week    Frequency of Social Gatherings with Friends and Family: More than three times a week    Attends Religious Services: Never    Marine scientist or Organizations: No    Attends Archivist Meetings: Never    Marital Status: Divorced  Human resources officer Violence: Not At Risk (07/14/2021)   Humiliation, Afraid, Rape, and Kick questionnaire    Fear of Current or Ex-Partner: No    Emotionally Abused: No    Physically Abused: No    Sexually Abused: No    Current Outpatient Medications  Medication Instructions   amLODipine (NORVASC) 5 mg, Oral,  Daily   atorvastatin (LIPITOR) 40 mg, Oral, Daily at bedtime   fexofenadine (ALLEGRA) 180 mg, Oral, Daily, OTC    fluticasone (FLONASE) 50 MCG/ACT nasal spray 2 sprays, Daily   KLOR-CON M20 20 MEQ tablet TAKE 1 TABLET BY MOUTH EVERY DAY   triamterene-hydrochlorothiazide (MAXZIDE-25) 37.5-25 MG tablet TAKE 1 TABLET BY MOUTH EVERY DAY       Objective:   Physical Exam BP 112/70   Pulse 78   Temp 98.5 F (36.9 C) (Oral)   Ht '5\' 1"'$  (1.549 m)   Wt 156 lb 12.8 oz (71.1 kg)   SpO2 98%   BMI 29.63 kg/m  General: Well developed, NAD, BMI noted Neck: Well-healed surgical scar, no mass or lymphadenopathy. HEENT:  Normocephalic . Face symmetric, atraumatic Lungs:  CTA B Normal respiratory effort, no intercostal retractions, no accessory muscle use. Heart: RRR,  no murmur.  Abdomen:  Not distended, soft, non-tender. No rebound or  rigidity.   Lower extremities: no pretibial edema bilaterally  Skin: Exposed areas without rash. Not pale. Not jaundice Neurologic:  alert & oriented X3.  Speech normal, gait appropriate for age and unassisted Strength symmetric and appropriate for age. Dysphonia: At baseline. Some tremors: At baseline Psych: Cognition and judgment appear intact.  Cooperative with normal attention span and concentration.  Behavior appropriate. No anxious or depressed appearing.     Assessment     Assessment  HTN Hyperlipidemia Thyroid cancer 2011, right-sided papillary microcarcinoma, R thyroidectomy   Aphonia, from essential tremor?Marland Kitchen  PLAN: Here for CPX HTN: On amlodipine, Maxide, potassium:, BP today is very good, no change, check BPs at home, labs. High cholesterol: On Lipitor, labs. Aphonia, essential tremor: At baseline, has decline evaluation before. RTC 6 months

## 2022-02-24 ENCOUNTER — Encounter: Payer: Self-pay | Admitting: Internal Medicine

## 2022-02-24 LAB — COMPREHENSIVE METABOLIC PANEL
AG Ratio: 1.5 (calc) (ref 1.0–2.5)
ALT: 15 U/L (ref 6–29)
AST: 19 U/L (ref 10–35)
Albumin: 4.3 g/dL (ref 3.6–5.1)
Alkaline phosphatase (APISO): 91 U/L (ref 37–153)
BUN/Creatinine Ratio: 18 (calc) (ref 6–22)
BUN: 19 mg/dL (ref 7–25)
CO2: 30 mmol/L (ref 20–32)
Calcium: 9.6 mg/dL (ref 8.6–10.4)
Chloride: 103 mmol/L (ref 98–110)
Creat: 1.08 mg/dL — ABNORMAL HIGH (ref 0.60–1.00)
Globulin: 2.8 g/dL (calc) (ref 1.9–3.7)
Glucose, Bld: 94 mg/dL (ref 65–99)
Potassium: 3.4 mmol/L — ABNORMAL LOW (ref 3.5–5.3)
Sodium: 141 mmol/L (ref 135–146)
Total Bilirubin: 0.7 mg/dL (ref 0.2–1.2)
Total Protein: 7.1 g/dL (ref 6.1–8.1)

## 2022-02-24 LAB — CBC WITH DIFFERENTIAL/PLATELET
Absolute Monocytes: 542 cells/uL (ref 200–950)
Basophils Absolute: 48 cells/uL (ref 0–200)
Basophils Relative: 0.5 %
Eosinophils Absolute: 247 cells/uL (ref 15–500)
Eosinophils Relative: 2.6 %
HCT: 40.1 % (ref 35.0–45.0)
Hemoglobin: 13.8 g/dL (ref 11.7–15.5)
Lymphs Abs: 2404 cells/uL (ref 850–3900)
MCH: 31.5 pg (ref 27.0–33.0)
MCHC: 34.4 g/dL (ref 32.0–36.0)
MCV: 91.6 fL (ref 80.0–100.0)
MPV: 9.8 fL (ref 7.5–12.5)
Monocytes Relative: 5.7 %
Neutro Abs: 6261 cells/uL (ref 1500–7800)
Neutrophils Relative %: 65.9 %
Platelets: 478 10*3/uL — ABNORMAL HIGH (ref 140–400)
RBC: 4.38 10*6/uL (ref 3.80–5.10)
RDW: 13 % (ref 11.0–15.0)
Total Lymphocyte: 25.3 %
WBC: 9.5 10*3/uL (ref 3.8–10.8)

## 2022-02-24 LAB — LIPID PANEL
Cholesterol: 174 mg/dL (ref <200)
HDL: 72 mg/dL (ref >=50)
LDL Cholesterol (Calc): 88 mg/dL (calc)
Non-HDL Cholesterol (Calc): 102 mg/dL (calc) (ref <130)
Total CHOL/HDL Ratio: 2.4 (calc) (ref <5.0)
Triglycerides: 62 mg/dL (ref <150)

## 2022-02-24 NOTE — Assessment & Plan Note (Signed)
-  Tdap 01-2012 - s/p covid vacc x 3 rec booster - Again declines other vaccines -Again declines screening for osteoporosis, cervical, colon or breast cancer.    --Labs: CMP, CBC, TSH -Lifestyle: She is active, still works at [employer]. - POA discussed

## 2022-02-24 NOTE — Assessment & Plan Note (Signed)
Here for CPX HTN: On amlodipine, Maxide, potassium:, BP today is very good, no change, check BPs at home, labs. High cholesterol: On Lipitor, labs. Aphonia, essential tremor: At baseline, has decline evaluation before. RTC 6 months

## 2022-02-28 ENCOUNTER — Other Ambulatory Visit: Payer: Self-pay | Admitting: Internal Medicine

## 2022-05-13 ENCOUNTER — Other Ambulatory Visit: Payer: Self-pay | Admitting: Internal Medicine

## 2022-08-10 ENCOUNTER — Other Ambulatory Visit: Payer: Self-pay | Admitting: Internal Medicine

## 2022-08-16 ENCOUNTER — Ambulatory Visit (INDEPENDENT_AMBULATORY_CARE_PROVIDER_SITE_OTHER): Payer: Medicare Other | Admitting: *Deleted

## 2022-08-16 DIAGNOSIS — Z0000 Encounter for general adult medical examination without abnormal findings: Secondary | ICD-10-CM

## 2022-08-16 NOTE — Progress Notes (Signed)
I have reviewed and agree with [facility] documentation.  Kathlene November, MD

## 2022-08-16 NOTE — Progress Notes (Signed)
Subjective:   Virginia Cox is a 77 y.o. female who presents for Medicare Annual (Subsequent) preventive examination.  I connected with  Donnetta Hutching on 08/16/22 by a audio enabled telemedicine application and verified that I am speaking with the correct person using two identifiers.  Patient Location: Home  Provider Location: Office/Clinic  I discussed the limitations of evaluation and management by telemedicine. The patient expressed understanding and agreed to proceed.   Review of Systems    Defer to PCP Cardiac Risk Factors include: advanced age (>54mn, >>74women);dyslipidemia;hypertension     Objective:    There were no vitals filed for this visit. There is no height or weight on file to calculate BMI.     08/16/2022    9:00 AM 08/06/2021    4:21 PM 07/14/2021    8:25 AM  Advanced Directives  Does Patient Have a Medical Advance Directive? No No No  Would patient like information on creating a medical advance directive? No - Patient declined  No - Patient declined    Current Medications (verified) Outpatient Encounter Medications as of 08/16/2022  Medication Sig   amLODipine (NORVASC) 5 MG tablet TAKE 1 TABLET (5 MG TOTAL) BY MOUTH DAILY.   atorvastatin (LIPITOR) 40 MG tablet TAKE 1 TABLET BY MOUTH EVERYDAY AT BEDTIME   fexofenadine (ALLEGRA) 180 MG tablet Take 180 mg by mouth daily. OTC   fluticasone (FLONASE) 50 MCG/ACT nasal spray Place 2 sprays into the nose daily.   KLOR-CON M20 20 MEQ tablet TAKE 1 TABLET BY MOUTH EVERY DAY   triamterene-hydrochlorothiazide (MAXZIDE-25) 37.5-25 MG tablet Take 1 tablet by mouth daily.   No facility-administered encounter medications on file as of 08/16/2022.    Allergies (verified) Patient has no known allergies.   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   Past Surgical  History:  Procedure Laterality Date   CESAREAN SECTION     x 1   THYROIDECTOMY  1/11      - papillary microcarcinoma   TUBAL LIGATION     Family History  Problem Relation Age of Onset   Coronary artery disease Father        MI 464s  Hypertension Mother    Coronary artery disease Brother        MI [id number]  Diabetes Neg Hx    Colon cancer Neg Hx    Breast cancer Neg Hx    Stroke Neg Hx    Social History   Socioeconomic History   Marital status: Divorced    Spouse name: Not on file   Number of children: 3   Years of education: Not on file   Highest education level: Not on file  Occupational History   Occupation: retired  iAgricultural consultant   Occupation: works at [employer] -time  Tobacco Use   Smoking status: Never   Smokeless tobacco: Never  Substance and Sexual Activity   Alcohol use: No   Drug use: No   Sexual activity: Not on file  Other Topics Concern   Not on file  Social History Narrative   divorced , lives w/ [place] daughter   3 kids , lost mother 09-2017, she was 925y/o, lived w/ her mom all her life   1 daughter lives w/ her   1 daughter in [place]  1 daughter in [place] Strain: Low Risk  (07/14/2021)   Overall Financial Resource Strain (CARDIA)    Difficulty of Paying Living Expenses: Not hard at all  Food Insecurity: No Food Insecurity (08/16/2022)   Hunger Vital Sign    Worried About Running Out of Food in the Last Year: Never true    Ran Out of Food in the Last Year: Never true  Transportation Needs: No Transportation Needs (08/16/2022)   PRAPARE - Hydrologist (Medical): No    Lack of Transportation (Non-Medical): No  Physical Activity: Inactive (07/14/2021)   Exercise Vital Sign    Days of Exercise per Week: 0 days    Minutes of Exercise per Session: 0 min  Stress: No Stress Concern Present (07/14/2021)   [place]    Feeling of Stress : Not at all  Social Connections: Socially Isolated (07/14/2021)   Social Connection and Isolation Panel [NHANES]    Frequency of Communication with Friends and Family: More than three times a week    Frequency of Social Gatherings with Friends and Family: More than three times a week    Attends Religious Services: Never    Marine scientist or Organizations: No    Attends Music therapist: Never    Marital Status: Divorced    Tobacco Counseling Counseling given: Not Answered   Clinical Intake:  Pre-visit preparation completed: Yes  Pain : No/denies pain  Diabetes: No  How often do you need to have someone help you when you read instructions, pamphlets, or other written materials from your doctor or pharmacy?: 1 - Never  Activities of Daily Living    08/16/2022    9:02 AM  In your present state of health, do you have any difficulty performing the following activities:  Hearing? 0  Vision? 0  Difficulty concentrating or making decisions? 0  Walking or climbing stairs? 0  Dressing or bathing? 0  Doing errands, shopping? 0  Preparing Food and eating ? N  Using the Toilet? N  In the past six months, have you accidently leaked urine? N  Do you have problems with loss of bowel control? N  Managing your Medications? N  Managing your Finances? N  Housekeeping or managing your Housekeeping? N    Patient Care Team: Colon Branch, MD as PCP - General Coralie Keens, MD as Consulting Physician (General Surgery)  Indicate any recent Medical Services you may have received from other than [facility] providers in the past year (date may be approximate).     Assessment:   This is a routine wellness examination for Virginia Cox.  Hearing/Vision screen No results found.  Dietary issues and exercise activities discussed: Current Exercise Habits: Home exercise routine, Type of exercise: walking, Time (Minutes): 45, Frequency  (Times/Week): 3 (weather permitting), Weekly Exercise (Minutes/Week): 135, Intensity: Mild, Exercise limited by: None identified   Goals Addressed   None    Depression Screen    08/16/2022    9:01 AM 02/23/2022    1:46 PM 08/07/2021    1:29 PM 07/14/2021    8:28 AM 08/23/2020    2:32 PM 01/29/2020    1:40 PM 01/28/2019   12:59 PM  PHQ 2/9 Scores  PHQ - 2 Score 1 0 0 0 0 0 0    Fall Risk    08/16/2022  9:00 AM 02/23/2022    1:46 PM 08/07/2021    1:28 PM 07/14/2021    8:27 AM 08/23/2020    2:32 PM  Fall Risk   Falls in the past year? 0 0 0 0 0  Number falls in past yr: 0 0 0 0 0  Injury with Fall? 0 0 0 0 0  Risk for fall due to : No Fall Risks No Fall Risks     Follow up Falls evaluation completed Falls evaluation completed Falls evaluation completed Falls prevention discussed     FALL RISK PREVENTION PERTAINING TO THE HOME:  Any stairs in or around the home? No  If so, are there any without handrails? No  Home free of loose throw rugs in walkways, pet beds, electrical cords, etc? Yes  Adequate lighting in your home to reduce risk of falls? Yes   ASSISTIVE DEVICES UTILIZED TO PREVENT FALLS:  Life alert? No  Use of a cane, walker or w/c? No  Grab bars in the bathroom? No  Shower chair or bench in shower? No  Elevated toilet seat or a handicapped toilet? No   TIMED UP AND GO:  Was the test performed?  No, audio visit .   Cognitive Function:        08/16/2022    9:06 AM  6CIT Screen  What Year? 0 points  What month? 0 points  What time? 0 points  Count back from 20 0 points  Months in reverse 0 points  Repeat phrase 4 points  Total Score 4 points    Immunizations Immunization History  Administered Date(s) Administered   PFIZER(Purple Top)SARS-COV-2 Vaccination 09/19/2019, 10/10/2019, 05/14/2020   Tdap 01/28/2012    TDAP status: Due, Education has been provided regarding the importance of this vaccine. Advised may receive this vaccine at local pharmacy or  Health Dept. Aware to provide a copy of the vaccination record if obtained from local pharmacy or Health Dept. Verbalized acceptance and understanding.  Flu Vaccine status: Declined, Education has been provided regarding the importance of this vaccine but patient still declined. Advised may receive this vaccine at local pharmacy or Health Dept. Aware to provide a copy of the vaccination record if obtained from local pharmacy or Health Dept. Verbalized acceptance and understanding.  Pneumococcal vaccine status: Declined,  Education has been provided regarding the importance of this vaccine but patient still declined. Advised may receive this vaccine at local pharmacy or Health Dept. Aware to provide a copy of the vaccination record if obtained from local pharmacy or Health Dept. Verbalized acceptance and understanding.   Covid-19 vaccine status: Information provided on how to obtain vaccines.   Qualifies for Shingles Vaccine? Yes   Zostavax completed No   Shingrix Completed?: No.    Education has been provided regarding the importance of this vaccine. Patient has been advised to call insurance company to determine out of pocket expense if they have not yet received this vaccine. Advised may also receive vaccine at local pharmacy or Health Dept. Verbalized acceptance and understanding.  Screening Tests Health Maintenance  Topic Date Due   DTaP/Tdap/Td (2 - Td or Tdap) 01/27/2022   Medicare Annual Wellness (AWV)  07/14/2022   INFLUENZA VACCINE  10/14/2022 (Originally 02/13/2022)   Hepatitis C Screening  Completed   HPV VACCINES  Aged Out   Pneumonia Vaccine 59+ Years old  Discontinued   MAMMOGRAM  Discontinued   DEXA SCAN  Discontinued   COLONOSCOPY (Pts 45-71yr Insurance coverage will need to be confirmed)  Discontinued   COVID-19 Vaccine  Discontinued   Zoster Vaccines- Shingrix  Discontinued    Health Maintenance  Health Maintenance Due  Topic Date Due   DTaP/Tdap/Td (2 - Td or Tdap)  01/27/2022   Medicare Annual Wellness (AWV)  07/14/2022    Colorectal cancer screening: No longer required. Pt declined.  Mammogram status: No longer required due to pt declined.  Bone Density status: pt declined.  Lung Cancer Screening: (Low Dose CT Chest recommended if Age 70-80 years, 30 pack-year currently smoking OR have quit w/in 15years.) does not qualify.   Additional Screening:  Hepatitis C Screening: does qualify; Completed 06/27/15  Vision Screening: Recommended annual ophthalmology exams for early detection of glaucoma and other disorders of the eye. Is the patient up to date with their annual eye exam?  Yes  Who is the provider or what is the name of the office in which the patient attends annual eye exams? [facility] If pt is not established with a provider, would they like to be referred to a provider to establish care? No .   Dental Screening: Recommended annual dental exams for proper oral hygiene  Community Resource Referral / Chronic Care Management: CRR required this visit?  No   CCM required this visit?  No      Plan:     I have personally reviewed and noted the following in the patient's chart:   Medical and social history Use of alcohol, tobacco or illicit drugs  Current medications and supplements including opioid prescriptions. Patient is not currently taking opioid prescriptions. Functional ability and status Nutritional status Physical activity Advanced directives List of other physicians Hospitalizations, surgeries, and ER visits in previous 12 months Vitals Screenings to include cognitive, depression, and falls Referrals and appointments  In addition, I have reviewed and discussed with patient certain preventive protocols, quality metrics, and best practice recommendations. A written personalized care plan for preventive services as well as general preventive health recommendations were provided to patient.   Due to this being a  telephonic visit, the after visit summary with patients personalized plan was offered to patient via mail or my-chart. Patient would like to access on my-chart.   Beatris Ship, [place]   08/16/2022   Nurse Notes: None

## 2022-08-16 NOTE — Patient Instructions (Signed)
Ms. Virginia Cox , Thank you for taking time to come for your Medicare Wellness Visit. I appreciate your ongoing commitment to your health goals. Please review the following plan we discussed and let me know if I can assist you in the future.   These are the goals we discussed:  Goals      Patient Stated     Maintain current health        This is a list of the screening recommended for you and due dates:  Health Maintenance  Topic Date Due   DTaP/Tdap/Td vaccine (2 - Td or Tdap) 01/27/2022   Flu Shot  10/14/2022*   Medicare Annual Wellness Visit  08/17/2023   Hepatitis C Screening: USPSTF Recommendation to screen - Ages 18-79 yo.  Completed   HPV Vaccine  Aged Out   Pneumonia Vaccine  Discontinued   Mammogram  Discontinued   DEXA scan (bone density measurement)  Discontinued   Colon Cancer Screening  Discontinued   COVID-19 Vaccine  Discontinued   Zoster (Shingles) Vaccine  Discontinued  *Topic was postponed. The date shown is not the original due date.     Next appointment: Follow up in one year for your annual wellness visit.   Preventive Care 75 Years and Older, Female Preventive care refers to lifestyle choices and visits with your health care provider that can promote health and wellness. What does preventive care include? A yearly physical exam. This is also called an annual well check. Dental exams once or twice a year. Routine eye exams. Ask your health care provider how often you should have your eyes checked. Personal lifestyle choices, including: Daily care of your teeth and gums. Regular physical activity. Eating a healthy diet. Avoiding tobacco and drug use. Limiting alcohol use. Practicing safe sex. Taking low-dose aspirin every day. Taking vitamin and mineral supplements as recommended by your health care provider. What happens during an annual well check? The services and screenings done by your health care provider during your annual well check will  depend on your age, overall health, lifestyle risk factors, and family history of disease. Counseling  Your health care provider may ask you questions about your: Alcohol use. Tobacco use. Drug use. Emotional well-being. Home and relationship well-being. Sexual activity. Eating habits. History of falls. Memory and ability to understand (cognition). Work and work Statistician. Reproductive health. Screening  You may have the following tests or measurements: Height, weight, and BMI. Blood pressure. Lipid and cholesterol levels. These may be checked every 5 years, or more frequently if you are over 34 years old. Skin check. Lung cancer screening. You may have this screening every year starting at age 75 if you have a 30-pack-year history of smoking and currently smoke or have quit within the past 15 years. Fecal occult blood test (FOBT) of the stool. You may have this test every year starting at age 75 Flexible sigmoidoscopy or colonoscopy. You may have a sigmoidoscopy every 5 years or a colonoscopy every 10 years starting at age 58. Hepatitis C blood test. Hepatitis B blood test. Sexually transmitted disease (STD) testing. Diabetes screening. This is done by checking your blood sugar (glucose) after you have not eaten for a while (fasting). You may have this done every 1-3 years. Bone density scan. This is done to screen for osteoporosis. You may have this done starting at age 28. Mammogram. This may be done every 1-2 years. Talk to your health care provider about how often you should have regular mammograms. Talk  with your health care provider about your test results, treatment options, and if necessary, the need for more tests. Vaccines  Your health care provider may recommend certain vaccines, such as: Influenza vaccine. This is recommended every year. Tetanus, diphtheria, and acellular pertussis (Tdap, Td) vaccine. You may need a Td booster every 10 years. Zoster vaccine. You may  need this after age 75 Pneumococcal 13-valent conjugate (PCV13) vaccine. One dose is recommended after age 75 Pneumococcal polysaccharide (PPSV23) vaccine. One dose is recommended after age 75 Talk to your health care provider about which screenings and vaccines you need and how often you need them. This information is not intended to replace advice given to you by your health care provider. Make sure you discuss any questions you have with your health care provider. Document Released: 07/29/2015 Document Revised: 03/21/2016 Document Reviewed: 05/03/2015 Elsevier Interactive Patient Education  2017 [employer] Prevention in the Home Falls can cause injuries. They can happen to people of all ages. There are many things you can do to make your home safe and to help prevent falls. What can I do on the outside of my home? Regularly fix the edges of walkways and driveways and fix any cracks. Remove anything that might make you trip as you walk through a door, such as a raised step or threshold. Trim any bushes or trees on the path to your home. Use bright outdoor lighting. Clear any walking paths of anything that might make someone trip, such as rocks or tools. Regularly check to see if handrails are loose or broken. Make sure that both sides of any steps have handrails. Any raised decks and porches should have guardrails on the edges. Have any leaves, snow, or ice cleared regularly. Use sand or salt on walking paths during winter. Clean up any spills in your garage right away. This includes oil or grease spills. What can I do in the bathroom? Use night lights. Install grab bars by the toilet and in the tub and shower. Do not use towel bars as grab bars. Use non-skid mats or decals in the tub or shower. If you need to sit down in the shower, use a plastic, non-slip stool. Keep the floor dry. Clean up any water that spills on the floor as soon as it happens. Remove soap buildup in  the tub or shower regularly. Attach bath mats securely with double-sided non-slip rug tape. Do not have throw rugs and other things on the floor that can make you trip. What can I do in the bedroom? Use night lights. Make sure that you have a light by your bed that is easy to reach. Do not use any sheets or blankets that are too big for your bed. They should not hang down onto the floor. Have a firm chair that has side arms. You can use this for support while you get dressed. Do not have throw rugs and other things on the floor that can make you trip. What can I do in the kitchen? Clean up any spills right away. Avoid walking on wet floors. Keep items that you use a lot in easy-to-reach places. If you need to reach something above you, use a strong step stool that has a grab bar. Keep electrical cords out of the way. Do not use floor polish or wax that makes floors slippery. If you must use wax, use non-skid floor wax. Do not have throw rugs and other things on the floor that can make you  trip. What can I do with my stairs? Do not leave any items on the stairs. Make sure that there are handrails on both sides of the stairs and use them. Fix handrails that are broken or loose. Make sure that handrails are as long as the stairways. Check any carpeting to make sure that it is firmly attached to the stairs. Fix any carpet that is loose or worn. Avoid having throw rugs at the top or bottom of the stairs. If you do have throw rugs, attach them to the floor with carpet tape. Make sure that you have a light switch at the top of the stairs and the bottom of the stairs. If you do not have them, ask someone to add them for you. What else can I do to help prevent falls? Wear shoes that: Do not have high heels. Have rubber bottoms. Are comfortable and fit you well. Are closed at the toe. Do not wear sandals. If you use a stepladder: Make sure that it is fully opened. Do not climb a closed  stepladder. Make sure that both sides of the stepladder are locked into place. Ask someone to hold it for you, if possible. Clearly mark and make sure that you can see: Any grab bars or handrails. First and last steps. Where the edge of each step is. Use tools that help you move around (mobility aids) if they are needed. These include: Canes. Walkers. Scooters. Crutches. Turn on the lights when you go into a dark area. Replace any light bulbs as soon as they burn out. Set up your furniture so you have a clear path. Avoid moving your furniture around. If any of your floors are uneven, fix them. If there are any pets around you, be aware of where they are. Review your medicines with your doctor. Some medicines can make you feel dizzy. This can increase your chance of falling. Ask your doctor what other things that you can do to help prevent falls. This information is not intended to replace advice given to you by your health care provider. Make sure you discuss any questions you have with your health care provider. Document Released: 04/28/2009 Document Revised: 12/08/2015 Document Reviewed: 08/06/2014 Elsevier Interactive Patient Education  2017 [employer].

## 2022-08-22 ENCOUNTER — Other Ambulatory Visit: Payer: Self-pay | Admitting: Internal Medicine

## 2022-08-27 ENCOUNTER — Ambulatory Visit (INDEPENDENT_AMBULATORY_CARE_PROVIDER_SITE_OTHER): Payer: Medicare Other | Admitting: Internal Medicine

## 2022-08-27 ENCOUNTER — Encounter: Payer: Self-pay | Admitting: Internal Medicine

## 2022-08-27 VITALS — BP 126/82 | HR 62 | Temp 98.2°F | Resp 16 | Ht 61.0 in | Wt 157.4 lb

## 2022-08-27 DIAGNOSIS — I10 Essential (primary) hypertension: Secondary | ICD-10-CM | POA: Diagnosis not present

## 2022-08-27 DIAGNOSIS — Z85850 Personal history of malignant neoplasm of thyroid: Secondary | ICD-10-CM

## 2022-08-27 NOTE — Progress Notes (Signed)
   Subjective:    Patient ID: Virginia Cox, female    DOB: 03/01/1946, 77 y.o.   MRN: [id number]  DOS:  08/27/2022 Type of visit - description: f/u  Since the last office visit is doing well. Denies any problems or concerns. She remains active taking walks regularly. Ambulatory BPs are normal when checked.  Review of Systems See above   Past Medical History:  Diagnosis Date   Allergic rhinitis    Arthritis    Hyperlipidemia    Hypertension    Thyroid cancer ([facility]) 1-11    R papillary microcarcinoma    Past Surgical History:  Procedure Laterality Date   CESAREAN SECTION     x 1   THYROIDECTOMY  1/11      - papillary microcarcinoma   TUBAL LIGATION      Current Outpatient Medications  Medication Instructions   amLODipine (NORVASC) 5 mg, Oral, Daily   atorvastatin (LIPITOR) 40 mg, Oral, Daily at bedtime   fexofenadine (ALLEGRA) 180 mg, Oral, Daily, OTC    fluticasone (FLONASE) 50 MCG/ACT nasal spray 2 sprays, Daily   KLOR-CON M20 20 MEQ tablet TAKE 1 TABLET BY MOUTH EVERY DAY   triamterene-hydrochlorothiazide (MAXZIDE-25) 37.5-25 MG tablet 1 tablet, Oral, Daily       Objective:   Physical Exam BP 126/82   Pulse 62   Temp 98.2 F (36.8 C) (Oral)   Resp 16   Ht '5\' 1"'$  (1.549 m)   Wt 157 lb 6 oz (71.4 kg)   SpO2 95%   BMI 29.74 kg/m  General:   Well developed, NAD, BMI noted. HEENT:  Normocephalic . Face symmetric, atraumatic Neck: No masses or lymphadenopathy noted Lungs:  CTA B Normal respiratory effort, no intercostal retractions, no accessory muscle use. Heart: RRR,  no murmur.  Lower extremities: no pretibial edema bilaterally  Skin: Not pale. Not jaundice Neurologic:  alert & oriented X3.  Speech normal, gait appropriate for age and unassisted Psych--  Cognition and judgment appear intact.  Cooperative with normal attention span and concentration.  Behavior appropriate. No anxious or depressed appearing.      Assessment    Assessment   HTN Hyperlipidemia Thyroid cancer 2011, right-sided papillary microcarcinoma, R thyroidectomy   [place], from essential tremor?Mar[place] Kitchen  PLAN: HTN: BP today is very good, reports normal ambulatory BPs, continue Maxide, amlodipine, potassium supplements.  Check BMP. History of R thyroidectomy due to cancer: Has consistently declined to have ultrasound of the area and declines again today. High cholesterol: Well-controlled on atorvastatin. Vaccine advice: Has declined consistently to have any further vaccines and declines again today. RTC 6 months CPX

## 2022-08-27 NOTE — Patient Instructions (Signed)
Check the  blood pressure regularly BP GOAL is between 110/65 and  135/85. If it is consistently higher or lower, let me know      GO TO THE LAB : Get the blood work     [place], [place] back for   a physical exam in 6 months

## 2022-08-27 NOTE — Assessment & Plan Note (Signed)
HTN: BP today is very good, reports normal ambulatory BPs, continue Maxide, amlodipine, potassium supplements.  Check BMP. History of R thyroidectomy due to cancer: Has consistently declined to have ultrasound of the area and declines again today. High cholesterol: Well-controlled on atorvastatin. Vaccine advice: Has declined consistently to have any further vaccines and declines again today. RTC 6 months CPX

## 2022-08-28 LAB — BASIC METABOLIC PANEL
BUN: 13 mg/dL (ref 6–23)
CO2: 31 mEq/L (ref 19–32)
Calcium: 9.8 mg/dL (ref 8.4–10.5)
Chloride: 100 mEq/L (ref 96–112)
Creatinine, Ser: 0.96 mg/dL (ref 0.40–1.20)
GFR: 58.25 mL/min — ABNORMAL LOW (ref >60.00)
Glucose, Bld: 82 mg/dL (ref 70–99)
Potassium: 3.9 mEq/L (ref 3.5–5.1)
Sodium: 140 mEq/L (ref 135–145)

## 2022-08-28 LAB — TSH: TSH: 1.51 u[IU]/mL (ref 0.35–5.50)

## 2022-10-27 ENCOUNTER — Other Ambulatory Visit: Payer: Self-pay | Admitting: Internal Medicine

## 2022-11-07 ENCOUNTER — Other Ambulatory Visit: Payer: Self-pay | Admitting: Internal Medicine

## 2023-02-02 ENCOUNTER — Other Ambulatory Visit: Payer: Self-pay | Admitting: Internal Medicine

## 2023-02-14 ENCOUNTER — Other Ambulatory Visit: Payer: Self-pay | Admitting: Internal Medicine

## 2023-02-25 ENCOUNTER — Encounter: Payer: Self-pay | Admitting: Internal Medicine

## 2023-02-25 ENCOUNTER — Ambulatory Visit (INDEPENDENT_AMBULATORY_CARE_PROVIDER_SITE_OTHER): Payer: Medicare Other | Admitting: Internal Medicine

## 2023-02-25 VITALS — BP 116/78 | HR 72 | Temp 97.8°F | Resp 16 | Ht 61.0 in | Wt 157.2 lb

## 2023-02-25 DIAGNOSIS — Z85850 Personal history of malignant neoplasm of thyroid: Secondary | ICD-10-CM | POA: Diagnosis not present

## 2023-02-25 DIAGNOSIS — E785 Hyperlipidemia, unspecified: Secondary | ICD-10-CM

## 2023-02-25 DIAGNOSIS — I10 Essential (primary) hypertension: Secondary | ICD-10-CM | POA: Diagnosis not present

## 2023-02-25 DIAGNOSIS — Z0000 Encounter for general adult medical examination without abnormal findings: Secondary | ICD-10-CM

## 2023-02-25 NOTE — Progress Notes (Unsigned)
   Subjective:    Patient ID: Virginia Cox, female    DOB: Apr 21, 1946, 77 y.o.   MRN: [id number]  DOS:  02/25/2023 Type of visit - description: CPX  Today he reports she is feeling well. No major concerns except still has some discomfort of the right ear described as possibly foreign body or a feeling of cold. Denies tinnitus, HOH, discharge or bleeding. Symptoms are not worse by chewing.   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    Past Surgical History:  Procedure Laterality Date   CESAREAN SECTION     x 1   THYROIDECTOMY  1/11      - papillary microcarcinoma   TUBAL LIGATION      Current Outpatient Medications  Medication Instructions   amLODipine (NORVASC) 5 mg, Oral, Daily   atorvastatin (LIPITOR) 40 mg, Oral, Daily at bedtime   fexofenadine (ALLEGRA) 180 mg, Oral, Daily, OTC    fluticasone (FLONASE) 50 MCG/ACT nasal spray 2 sprays, Daily   potassium chloride SA (KLOR-CON M20) 20 MEQ tablet TAKE 1 TABLET BY MOUTH EVERY DAY   triamterene-hydrochlorothiazide (MAXZIDE-25) 37.5-25 MG tablet 1 tablet, Oral, Daily       Objective:   Physical Exam BP 116/78   Pulse 72   Temp 97.8 F (36.6 C) (Oral)   Resp 16   Ht 5\' 1"  (1.549 m)   Wt 157 lb 4 oz (71.3 kg)   SpO2 96%   BMI 29.71 kg/m  General: Well developed, NAD, BMI noted Neck: No  thyromegaly  HEENT:  Normocephalic . Face symmetric, atraumatic. TMs normal bilaterally Lungs:  CTA B Normal respiratory effort, no intercostal retractions, no accessory muscle use. Heart: RRR,  no murmur.  Abdomen:  Not distended, soft, non-tender. No rebound or rigidity.   Lower extremities: no pretibial edema bilaterally  Skin: Exposed areas without rash. Not pale. Not jaundice Neurologic:  alert & oriented X3.  Speech normal, gait appropriate for age and  unassisted Strength symmetric and appropriate for age.  Psych: Cognition and judgment appear intact.  Cooperative with normal attention span and concentration.  Behavior appropriate. No anxious or depressed appearing.     Assessment     Assessment  HTN Hyperlipidemia Thyroid cancer 2011, right-sided papillary microcarcinoma, R thyroidectomy   [place], from essential tremor?Marland Kitchen  PLAN: Here for CPX -Tdap 01-2012 - Declines other vaccines -Consistently declined at screening for osteoporosis, cervical, colon or breast cancer.    --Labs: CMP FLP CBC TSH -Lifestyle: Tries to eat healthy, works at [employer] part-time. - POA discussed, information provided [id number] to check BPs at home, continue amlodipine, Maxide, KCl.  Labs Hyperlipidemia: On atorvastatin.  Labs History of thyroid cancer: See previous entries.  Denies further eval. RTC 1 year.  Sooner if needed.    HTN: BP today is very good, reports normal ambulatory BPs, continue Maxide, amlodipine, potassium supplements.  Check BMP. History of R thyroidectomy due to cancer: Has consistently declined to have ultrasound of the area and declines again today. High cholesterol: Well-controlled on atorvastatin. Vaccine advice: Has declined consistently to have any further vaccines and declines again today. RTC 6 months CPX

## 2023-02-25 NOTE — Patient Instructions (Signed)
  Check the  blood pressure regularly Blood pressure goal:  between 110/65 and  135/85. If it is consistently higher or lower, let me know     GO TO THE LAB : Get the blood work     GO TO THE FRONT DESK, PLEASE SCHEDULE YOUR APPOINTMENTS Come back for   a physical exam in 1 year.  Sooner if needed.   Health Care Power of attorney" ,  "Living will" (Advance care planning documents)  If you already have a living will or healthcare power of attorney, is recommended you bring the copy to be scanned in your chart.   The document will be available to all the doctors you see in the system.  Advance care planning is a process that supports adults in  understanding and sharing their preferences regarding future medical care.  The patient's preferences are recorded in documents called Advance Directives and the can be modified at any time while the patient is in full mental capacity.   If you don't have one, please consider create one.      More information at: [email or web address]

## 2023-02-26 ENCOUNTER — Encounter: Payer: Self-pay | Admitting: Internal Medicine

## 2023-02-26 NOTE — Assessment & Plan Note (Signed)
Here for CPX -Tdap 01-2012 - Declines other vaccines -Consistently declined at screening for osteoporosis, cervical, colon or breast cancer.    --Labs: CMP FLP CBC TSH -Lifestyle: Tries to eat healthy, works at [employer] part-time. - POA discussed, information provided

## 2023-02-26 NOTE — Assessment & Plan Note (Signed)
Here for CPX GEX:[id number] to check BPs at home, continue amlodipine, Maxide, KCl.  Labs Hyperlipidemia: On atorvastatin.  Labs History of thyroid cancer: See previous entries.  Denies further eval. RTC 1 year.  Sooner if needed.

## 2023-02-28 ENCOUNTER — Other Ambulatory Visit: Payer: Self-pay

## 2023-02-28 DIAGNOSIS — D75839 Thrombocytosis, unspecified: Secondary | ICD-10-CM

## 2023-03-11 ENCOUNTER — Other Ambulatory Visit: Payer: Self-pay | Admitting: Family

## 2023-03-11 DIAGNOSIS — D509 Iron deficiency anemia, unspecified: Secondary | ICD-10-CM

## 2023-03-11 DIAGNOSIS — D473 Essential (hemorrhagic) thrombocythemia: Secondary | ICD-10-CM

## 2023-03-11 DIAGNOSIS — D75839 Thrombocytosis, unspecified: Secondary | ICD-10-CM

## 2023-03-12 ENCOUNTER — Inpatient Hospital Stay: Payer: Medicare Other

## 2023-03-12 ENCOUNTER — Inpatient Hospital Stay (HOSPITAL_BASED_OUTPATIENT_CLINIC_OR_DEPARTMENT_OTHER): Payer: Medicare Other | Admitting: Family

## 2023-03-12 VITALS — BP 152/83 | HR 62 | Temp 98.1°F | Resp 18 | Ht 61.5 in | Wt 157.1 lb

## 2023-03-12 DIAGNOSIS — D473 Essential (hemorrhagic) thrombocythemia: Secondary | ICD-10-CM

## 2023-03-12 DIAGNOSIS — D75839 Thrombocytosis, unspecified: Secondary | ICD-10-CM | POA: Insufficient documentation

## 2023-03-12 DIAGNOSIS — Z85850 Personal history of malignant neoplasm of thyroid: Secondary | ICD-10-CM | POA: Diagnosis not present

## 2023-03-12 DIAGNOSIS — D509 Iron deficiency anemia, unspecified: Secondary | ICD-10-CM

## 2023-03-12 LAB — CBC WITH DIFFERENTIAL (CANCER CENTER ONLY)
Abs Immature Granulocytes: 0.02 10*3/uL (ref 0.00–0.07)
Basophils Absolute: 0.1 10*3/uL (ref 0.0–0.1)
Basophils Relative: 1 %
Eosinophils Absolute: 0.2 10*3/uL (ref 0.0–0.5)
Eosinophils Relative: 2 %
HCT: 42.4 % (ref 36.0–46.0)
Hemoglobin: 14.1 g/dL (ref 12.0–15.0)
Immature Granulocytes: 0 %
Lymphocytes Relative: 26 %
Lymphs Abs: 2 10*3/uL (ref 0.7–4.0)
MCH: 31.3 pg (ref 26.0–34.0)
MCHC: 33.3 g/dL (ref 30.0–36.0)
MCV: 94 fL (ref 80.0–100.0)
Monocytes Absolute: 0.5 10*3/uL (ref 0.1–1.0)
Monocytes Relative: 7 %
Neutro Abs: 4.9 10*3/uL (ref 1.7–7.7)
Neutrophils Relative %: 64 %
Platelet Count: 522 10*3/uL — ABNORMAL HIGH (ref 150–400)
RBC: 4.51 MIL/uL (ref 3.87–5.11)
RDW: 13.2 % (ref 11.5–15.5)
WBC Count: 7.7 10*3/uL (ref 4.0–10.5)
nRBC: 0 % (ref 0.0–0.2)

## 2023-03-12 LAB — CMP (CANCER CENTER ONLY)
ALT: 16 U/L (ref 0–44)
AST: 18 U/L (ref 15–41)
Albumin: 4.5 g/dL (ref 3.5–5.0)
Alkaline Phosphatase: 88 U/L (ref 38–126)
Anion gap: 6 (ref 5–15)
BUN: 20 mg/dL (ref 8–23)
CO2: 33 mmol/L — ABNORMAL HIGH (ref 22–32)
Calcium: 9.8 mg/dL (ref 8.9–10.3)
Chloride: 103 mmol/L (ref 98–111)
Creatinine: 1.04 mg/dL — ABNORMAL HIGH (ref 0.44–1.00)
GFR, Estimated: 56 mL/min — ABNORMAL LOW (ref >60)
Glucose, Bld: 101 mg/dL — ABNORMAL HIGH (ref 70–99)
Potassium: 3.6 mmol/L (ref 3.5–5.1)
Sodium: 142 mmol/L (ref 135–145)
Total Bilirubin: 0.5 mg/dL (ref 0.3–1.2)
Total Protein: 7.6 g/dL (ref 6.5–8.1)

## 2023-03-12 LAB — IRON AND IRON BINDING CAPACITY (CC-WL,HP ONLY)
Iron: 54 ug/dL (ref 28–170)
Saturation Ratios: 17 % (ref 10.4–31.8)
TIBC: 318 ug/dL (ref 250–450)
UIBC: 264 ug/dL (ref 148–442)

## 2023-03-12 LAB — SAVE SMEAR(SSMR), FOR PROVIDER SLIDE REVIEW

## 2023-03-12 LAB — FERRITIN: Ferritin: 40 ng/mL (ref 11–307)

## 2023-03-12 NOTE — Progress Notes (Signed)
Hematology/Oncology Consultation   Name: Virginia Cox      MRN: [id number]    Location: Room/bed info not found  Date: 03/12/2023 Time:9:11 AM   REFERRING PHYSICIAN: Willow Ora, MD  REASON FOR CONSULT: Thrombocytosis    DIAGNOSIS: Thrombocytosis, lab work up pending   HISTORY OF PRESENT ILLNESS: Virginia Cox is a very pleasant 77 yo African American female with mild thrombocytosis noted over the last 3 years. Her platelets today are 522, Hgb 14.1, MCV 94 and WBC count 7.7.  She has not noted any blood loss. No abnormal bruising, no petechiae.  Her daughter has history of thrombotic event. Patient has no history of stroke or thrombotic event.  She denies fatigue at this time.  No known familial history of blood abnormalities or thrombocythemia.  No history of sickle cell disease or trait.  No history of diabetes.  She has history of right sided papillary micro carcinoma of the thyroid diagnosed in 2011. She was treated with a right thyroidectomy.  Her brother had prostate cancer and her mother had an unknown primary at the age of 51.  She has refused mammogram and colonoscopy. She states that she had had the Cologuard test in the past and result was negative.  No fever, chills, n/v, cough, rash, dizziness, SOB, chest pain, palpitations, abdominal pain or changes in bowel or bladder habits.  She has occasional abdominal bloating with high fiber foods and flatus.  No pain on exam pf abdomen today. No organomegaly noted.  No known liver or spleen issues. Spleen is in place.  No swelling, tenderness, numbness or tingling in her extremities at this time. Some pain in the left palm that comes and goes. She attributes this to possible arthritis.   ROS: All other 10 point review of systems is negative.   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 90 tablet 2   atorvastatin (LIPITOR) 40 MG tablet Take 1 tablet (40 mg total) by mouth at bedtime. 90 tablet 0   fexofenadine (ALLEGRA) 180 MG tablet Take 180 mg by mouth daily. OTC     fluticasone (FLONASE) 50 MCG/ACT nasal spray Place 2 sprays into the nose daily.     potassium chloride SA (KLOR-CON M20) 20 MEQ tablet TAKE 1 TABLET BY MOUTH EVERY DAY 90 tablet 0   triamterene-hydrochlorothiazide (MAXZIDE-25) 37.5-25 MG tablet Take 1 tablet by mouth daily. 90 tablet 1   No current facility-administered medications on file prior to visit.     PAST SURGICAL HISTORY Past Surgical History:  Procedure Laterality Date   CESAREAN SECTION     x 1   THYROIDECTOMY  1/11      - papillary microcarcinoma   TUBAL LIGATION      FAMILY HISTORY: Family History  Problem Relation Age of Onset   Coronary artery disease Father        MI 50s   Hypertension Mother    Coronary artery disease Brother        MI 68   Diabetes Neg Hx    Colon cancer Neg Hx  Breast cancer Neg Hx    Stroke Neg Hx     SOCIAL HISTORY:  reports that she has never smoked. She has never used smokeless tobacco. She reports that she does not drink alcohol and does not use drugs.  PERFORMANCE STATUS: The patient's performance status is 0 - Asymptomatic  PHYSICAL EXAM: Most Recent Vital Signs: There were no vitals taken for this visit. BP (!) 152/83 (BP Location: Left Arm, Patient Position: Sitting)   Pulse 62   Temp 98.1 F (36.7 C) (Oral)   Resp 18   Ht 5' 1.5" (1.562 m)   Wt 157 lb 1.3 oz (71.3 kg)   SpO2 98%   BMI 29.20 kg/m   General Appearance:    Alert, cooperative, no distress, appears stated age  Head:    Normocephalic, without obvious abnormality, atraumatic  Eyes:     PERRL, conjunctiva/corneas clear, EOM's intact, fundi    benign, both eyes        Throat:   Lips, mucosa, and tongue normal; teeth and gums normal  Neck:   Supple, symmetrical, trachea midline, no adenopathy;    thyroid:  no enlargement/tenderness/nodules; no carotid   bruit or JVD  Back:     Symmetric, no curvature, ROM normal, no CVA tenderness  Lungs:     Clear to auscultation bilaterally, respirations unlabored  Chest Wall:    No tenderness or deformity   Heart:    Regular rate and rhythm, S1 and S2 normal, no murmur, rub   or gallop     Abdomen:     Soft, non-tender, bowel sounds active all four quadrants,    no masses, no organomegaly        Extremities:   Extremities normal, atraumatic, no cyanosis or edema  Pulses:   2+ and symmetric all extremities  Skin:   Skin color, texture, turgor normal, no rashes or lesions  Lymph nodes:   Cervical, supraclavicular, and axillary nodes normal  Neurologic:   CNII-XII intact, normal strength, sensation and reflexes    throughout    LABORATORY DATA:  Results for orders placed or performed in visit on 03/12/23 (from the past 48 hour(s))  Save Smear for Provider Slide Review     Status: None   Collection Time: 03/12/23  8:32 AM  Result Value Ref Range   Smear Review SMEAR STAINED AND AVAILABLE FOR REVIEW     Comment: Performed at [facility] Lab at [facility], [address], [place], [place] [address]  CMP (Cancer Center only)     Status: Abnormal   Collection Time: 03/12/23  8:32 AM  Result Value Ref Range   Sodium 142 135 - 145 mmol/L   Potassium 3.6 3.5 - 5.1 mmol/L   Chloride 103 98 - 111 mmol/L   CO2 33 (H) 22 - 32 mmol/L   Glucose, Bld 101 (H) 70 - 99 mg/dL    Comment: Glucose reference range applies only to samples taken after fasting for at least 8 hours.   BUN 20 8 - 23 mg/dL   Creatinine 5.64 (H) 3.32 - 1.00 mg/dL   Calcium 9.8 8.9 - 95.1 mg/dL   Total Protein 7.6 6.5 - 8.1 g/dL   Albumin 4.5  3.5 - 5.0 g/dL   AST 18 15 - 41 U/L   ALT 16 0 - 44 U/L   Alkaline Phosphatase 88 38 - 126 U/L   Total Bilirubin 0.5 0.3 - 1.2 mg/dL   GFR, Estimated 56 (L) >  60 mL/min    Comment: (NOTE) Calculated using the CKD-EPI Creatinine Equation (2021)    Anion gap 6 5 - 15    Comment: Performed at [facility] Lab at [facility], [address] [place], [place] [address]  CBC with Differential (Cancer Center Only)     Status: Abnormal   Collection Time: 03/12/23  8:32 AM  Result Value Ref Range   WBC Count 7.7 4.0 - 10.5 K/uL   RBC 4.51 3.87 - 5.11 MIL/uL   Hemoglobin 14.1 12.0 - 15.0 g/dL   HCT 19.1 47.8 - 29.5 %   MCV 94.0 80.0 - 100.0 fL   MCH 31.3 26.0 - 34.0 pg   MCHC 33.3 30.0 - 36.0 g/dL   RDW 62.1 30.8 - 65.7 %   Platelet Count 522 (H) 150 - 400 K/uL   nRBC 0.0 0.0 - 0.2 %   Neutrophils Relative % 64 %   Neutro Abs 4.9 1.7 - 7.7 K/uL   Lymphocytes Relative 26 %   Lymphs Abs 2.0 0.7 - 4.0 K/uL   Monocytes Relative 7 %   Monocytes Absolute 0.5 0.1 - 1.0 K/uL   Eosinophils Relative 2 %   Eosinophils Absolute 0.2 0.0 - 0.5 K/uL   Basophils Relative 1 %   Basophils Absolute 0.1 0.0 - 0.1 K/uL   Immature Granulocytes 0 %   Abs Immature Granulocytes 0.02 0.00 - 0.07 K/uL    Comment: Performed at [facility] at [facility], [address] [place], [place] [address]      RADIOGRAPHY: No results found.     PATHOLOGY: None   ASSESSMENT/PLAN: Virginia Cox is a very pleasant 77 yo [place] American female with mild thrombocytosis noted over the last 3 years.  CBC with diff and blood smear reviewed with Dr. Myna Hidalgo. Platelets appear larger in size but smear otherwise unremarkable.  Iron studies, JAK 2 and VWB panel are pending.  Follow-up pending results.   All questions were answered. The patient knows to call the clinic with any problems, questions or concerns. We can certainly see the patient much sooner if necessary.  The  patient was discussed with Dr. Myna Hidalgo and he is in agreement with the aforementioned.   Eileen Stanford, NP

## 2023-03-13 LAB — VON WILLEBRAND PANEL
Coagulation Factor VIII: 160 % — ABNORMAL HIGH (ref 56–140)
Ristocetin Co-factor, Plasma: 160 % (ref 50–200)
Von Willebrand Antigen, Plasma: 203 % — ABNORMAL HIGH (ref 50–200)

## 2023-03-13 LAB — COAG STUDIES INTERP REPORT

## 2023-03-16 IMAGING — CT CT HEAD W/O CM
3 series · 15 of 47 positions shown, 18 images · non-contrast
Comparison: None.

CLINICAL DATA: Dizziness



[Series 2: head wo · axial · 0.43mm/px · z∈[+777,+922]mm · 9 of 35 slices shown, 12 images]
[im 3/35  brain]
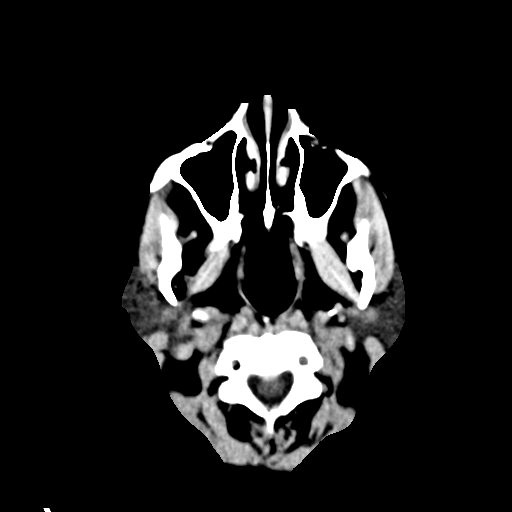
[im 3/35  bone]
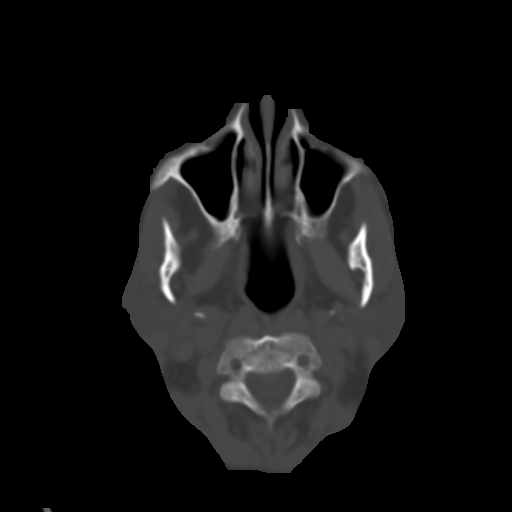
[im 6/35  brain]
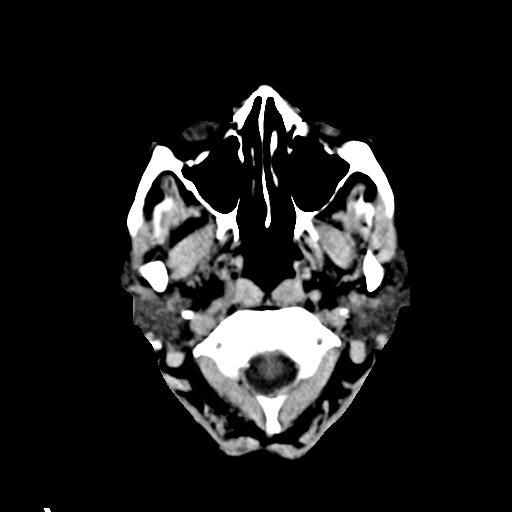
[im 10/35  brain]
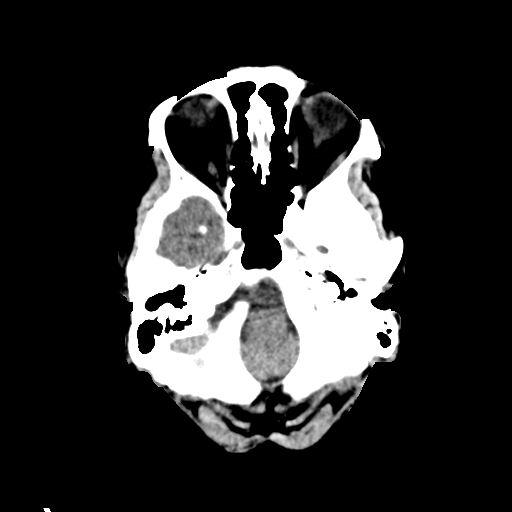
[im 13/35  brain]
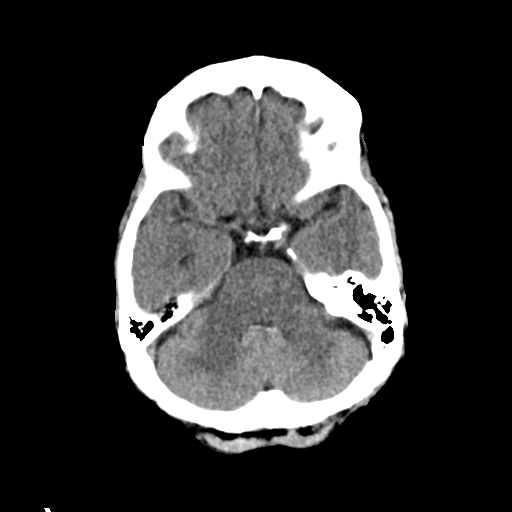
[im 18/35  brain]
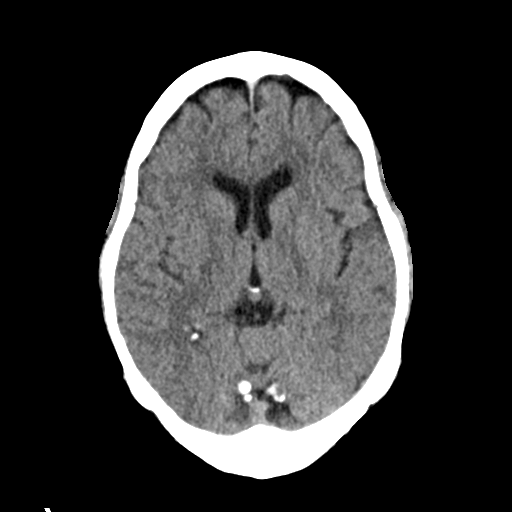
[im 18/35  bone]
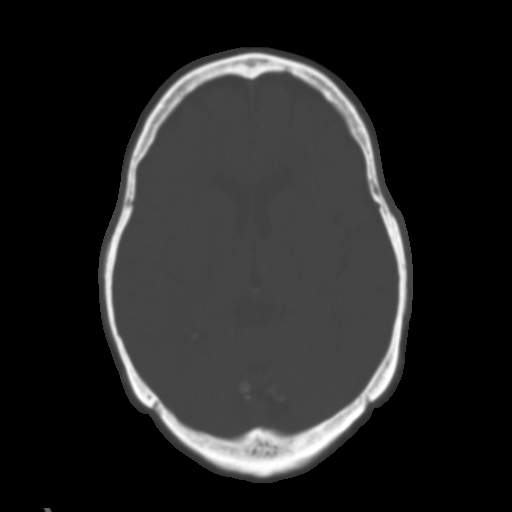
[im 22/35  brain]
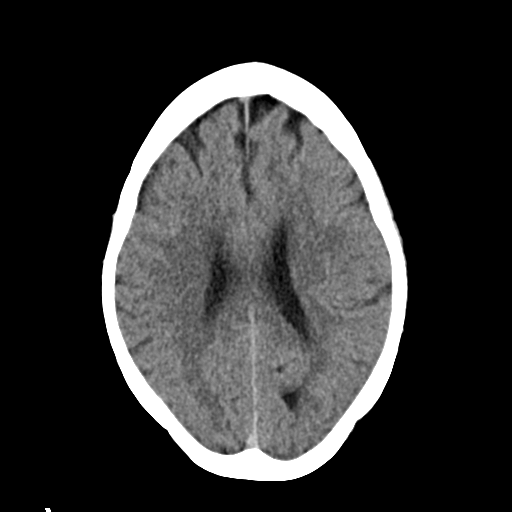
[im 25/35  brain]
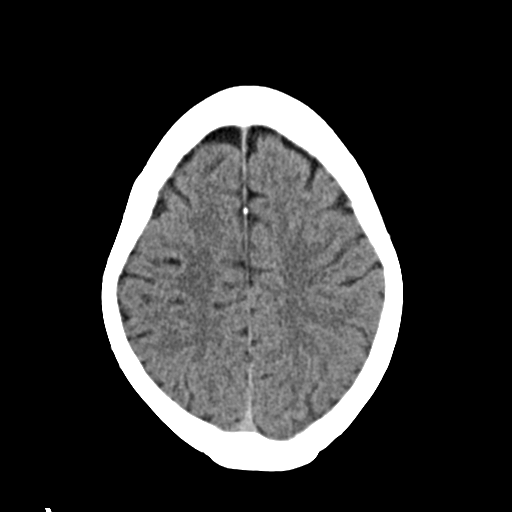
[im 29/35  brain]
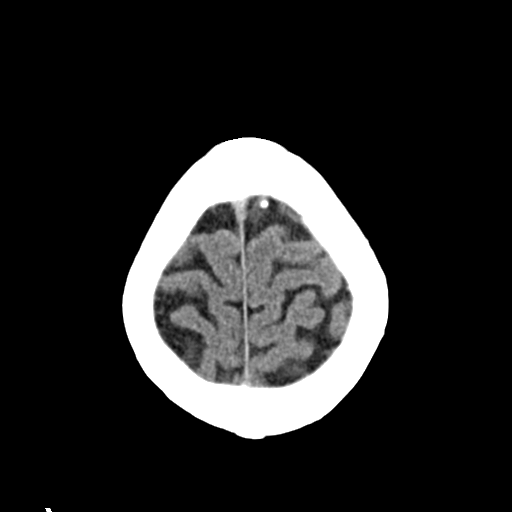
[im 32/35  brain]
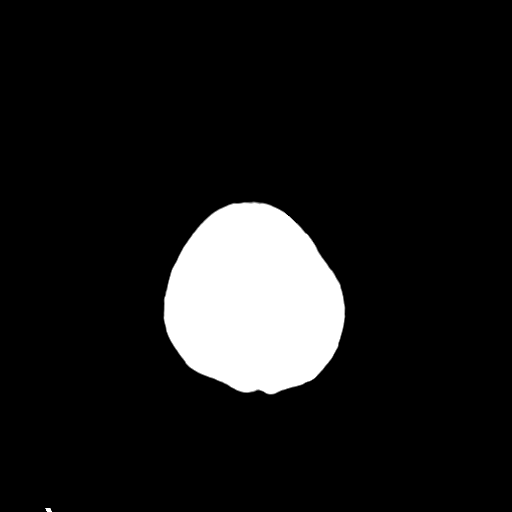
[im 32/35  bone]
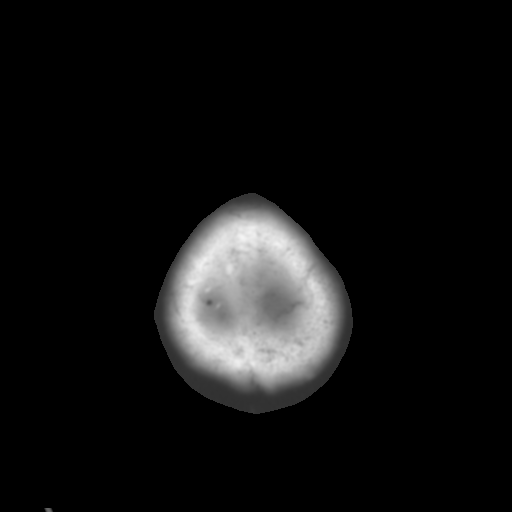

[Series 4: coronal soft · coronal · 0.34mm/px · 3 of 75 slices shown]
[im 25/75  brain]
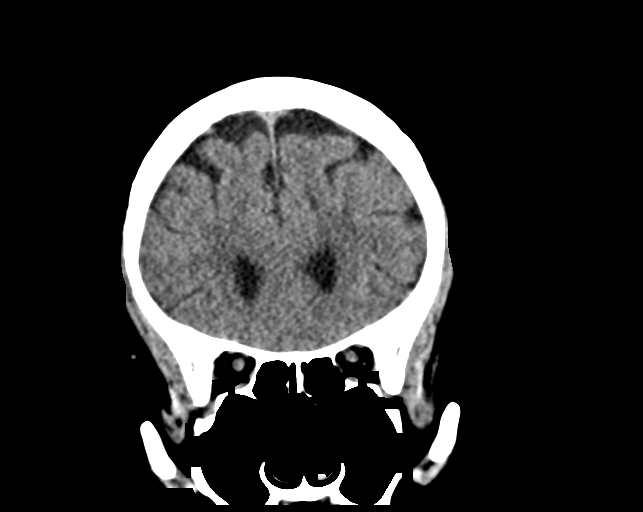
[im 33/75  brain]
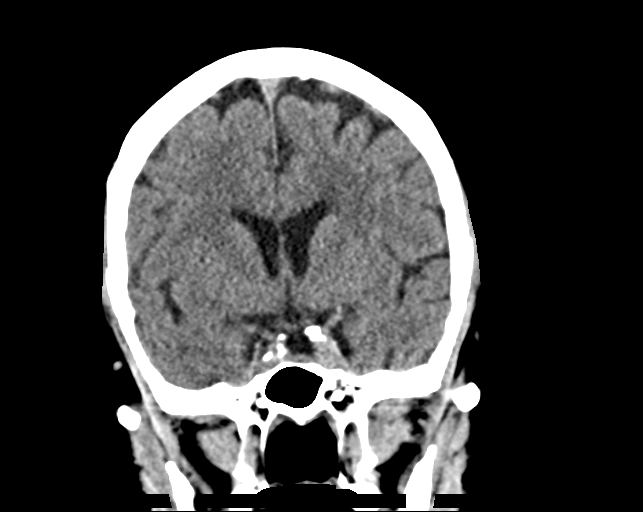
[im 42/75  brain]
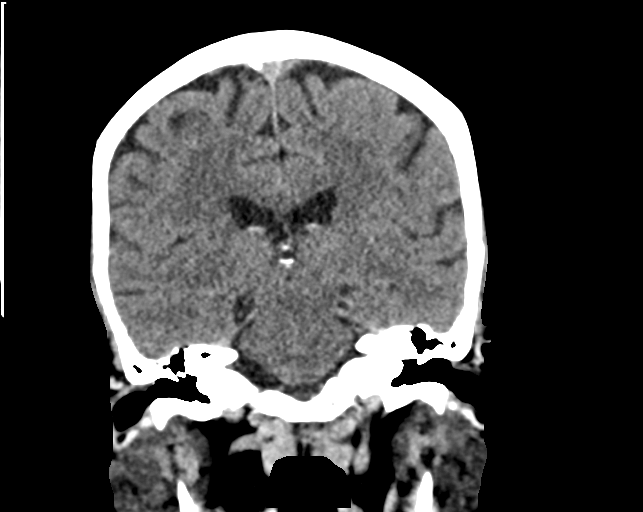

[Series 5: sag soft · sagittal · 0.34mm/px · 3 of 64 slices shown]
[im 22/64  brain]
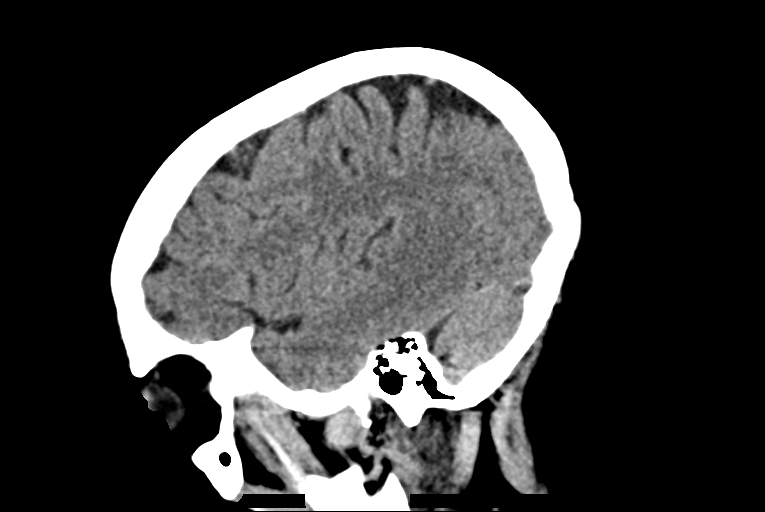
[im 32/64  brain]
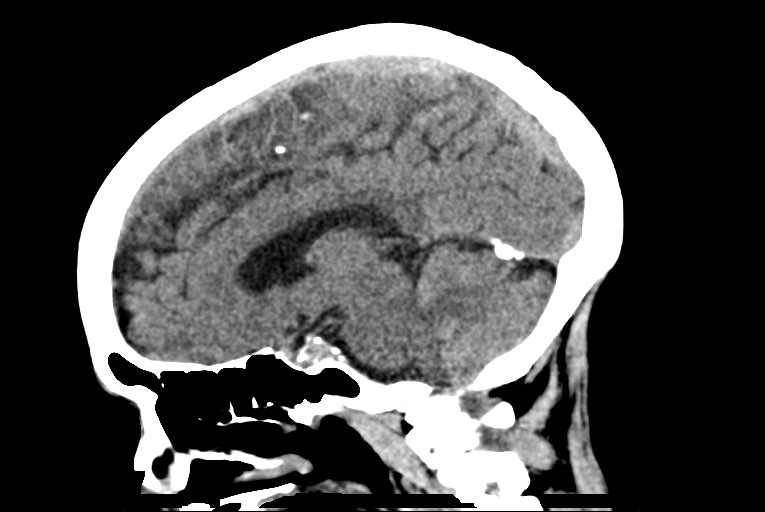
[im 43/64  brain]
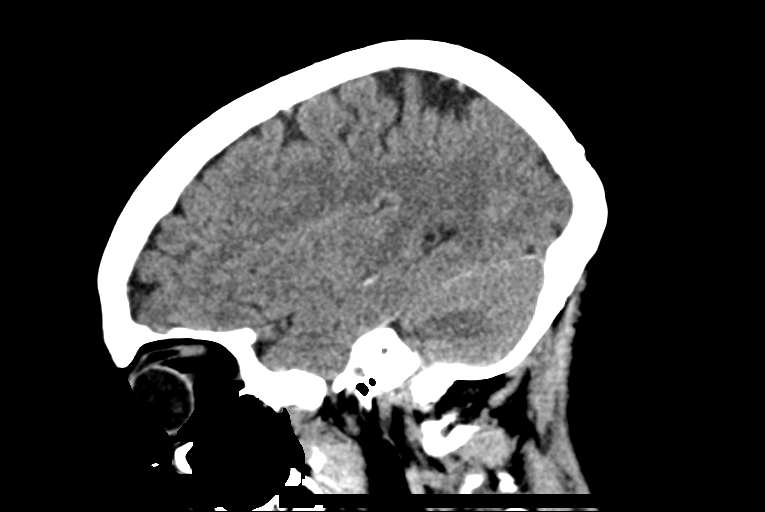

[15 of 47 positions shown; findings below may reference images not displayed]

FINDINGS: Brain: No acute intracranial abnormality. Specifically, no
hemorrhage, hydrocephalus, mass lesion, acute infarction, or
significant intracranial injury.

Vascular: No hyperdense vessel or unexpected calcification.

Skull: No acute calvarial abnormality.

Sinuses/Orbits: No acute findings

Other: None
IMPRESSION: Normal study.

## 2023-03-20 LAB — JAK2 (INCLUDING V617F AND EXON 12), MPL,& CALR-NEXT GEN SEQ

## 2023-04-08 ENCOUNTER — Encounter: Payer: Self-pay | Admitting: Pharmacist

## 2023-04-08 ENCOUNTER — Telehealth: Payer: Self-pay | Admitting: Pharmacist

## 2023-04-08 NOTE — Telephone Encounter (Signed)
-   patient mentioned that she reviewed her labs from 03/12/2023 from hematology clinic and she was unsure what they meant. She would like call from hematology clinic to discuss results.

## 2023-04-08 NOTE — Progress Notes (Signed)
Patient appearing on report for [employer] [facility] [employer] - Hypertension Control report due to last documented ambulatory blood pressure of 152/83 on 03/12/2023 at hematology / oncology office. Outreached patient to discuss hypertension control and medication management.   Patient had blood pressure checked in PCP office 02/25/2023 and blood pressure was 116/78;  Next appointment with PCP is 02/28/2024   Current antihypertensives:  Amlodipine 5mg  daily - LR was for 90 DS on 01/25/2023 Triamterene - hydrochlorothiazide daily - LR was for 90 DS 02/03/2023  Patient does not have an automated upper arm home BP machine.  Current blood pressure readings: did not have exact readings but states when she checked blood pressure on 04/05/2023 it was high ("in the orange area") but then checked again and was better.   Current meal patterns:  Reports she does add salt to foods most of the time  Current physical activity: walking about 2 times per week about 30 minutes; she is also still working about 10 to 25 hours per week at [employer] and walks a lot for her job.    SDOH Interventions Today    Flowsheet Row Most Recent Value  SDOH Interventions   Physical Activity Interventions Other (Comments)       Assessment/Plan: - Currently uncontrolled - - Reviewed goal blood pressure <140/90 - Reviewed appropriate administration of medication regimen - Reviewed appropriate home BP monitoring technique (avoid caffeine, smoking, and exercise for 30 minutes before checking, rest for at least 5 minutes before taking BP, sit with feet flat on the floor and back against a hard surface, uncross legs, and rest arm on flat surface) - Reviewed to check blood pressure 2 to 3 times per week, document, and provide at next provider visit - Discussed dietary modifications, such as reduced salt intake, focus on whole grains, vegetables, lean proteins - Discussed goal of 150 minutes of moderate intensity  physical activity weekly - Recommend continue amlodipine and triamterene - hydrochlorothiazide  - patient will come to office 04/10/2023 for blood pressure check and will also check with her blood pressure monitor to verify accuracy.  - patient mentioned that she reviewed her labs from 03/12/2023 from hematology clinic and she was unsure what they meant. She has not received a phone call about results yet. Sent message to hematology clinic to reach out to patient to discuss labs.   Henrene Pastor, PharmD Clinical Pharmacist [facility]

## 2023-04-10 ENCOUNTER — Ambulatory Visit (INDEPENDENT_AMBULATORY_CARE_PROVIDER_SITE_OTHER): Payer: Medicare Other | Admitting: Pharmacist

## 2023-04-10 VITALS — BP 136/79 | HR 71

## 2023-04-10 DIAGNOSIS — I10 Essential (primary) hypertension: Secondary | ICD-10-CM

## 2023-04-10 NOTE — Progress Notes (Signed)
04/10/2023 Name: Virginia Cox MRN: [id number] DOB: 03-08-1946  Chief Complaint  Patient presents with   Hypertension    Virginia Cox is a 77 y.o. year old female who was referred for medication management by their primary care provider, Wanda Plump, MD. They presented for a face to face visit today.   They were referred to the pharmacist by a quality report for assistance in managing hypertension    Subjective: Patient appearing on report for [employer] - Hypertension Control report due to last documented ambulatory blood pressure of 152/83 on 03/12/2023 at hematology / oncology office. .   Patient had blood pressure checked in PCP office 02/25/2023 and blood pressure was 116/78;  Next appointment with PCP is 02/28/2024  Current antihypertensives:  Amlodipine 5mg  daily - LR was for 90 DS on 01/25/2023 Triamterene - hydrochlorothiazide daily - LR was for 90 DS 02/03/2023  Patient does have an automated upper arm home BP machine reports that her blood pressure is never in the green -usually in the yellow range and sometime orange.   Current meal patterns:  Since our phone visit last week she has been using less salt   Current physical activity: walking about 2 times per week about 30 minutes; she is also still working about 10 to 25 hours per week at [employer] and walks a lot for her job.   Checked blood pressure with office manual blood pressure cuff and patient's home blood pressure cuff which is automated.  Office blood pressure monitor - blood pressure was 138/82 and HR 72 Home blood pressure monitor - blood pressure was 136/79 and HR 71  BP Readings from Last 3 Encounters:  04/10/23 136/79  03/12/23 (!) 152/83  02/25/23 116/78     Objective:  No results found for: "HGBA1C"  Lab Results  Component Value Date   CREATININE 1.04 (H) 03/12/2023   BUN 20 03/12/2023   NA 142 03/12/2023   K 3.6 03/12/2023   CL 103 03/12/2023    CO2 33 (H) 03/12/2023    Lab Results  Component Value Date   CHOL 179 02/25/2023   HDL 76.00 02/25/2023   LDLCALC 90 02/25/2023   LDLDIRECT 164.6 01/28/2012   TRIG 69.0 02/25/2023   CHOLHDL 2 02/25/2023    Medications Reviewed Today     Reviewed by Henrene Pastor, RPH-CPP (Pharmacist) on 04/10/23 at 1508  Med List Status: <None>   Medication Order Taking? Sig Documenting Provider Last Dose Status Informant  amLODipine (NORVASC) 5 MG tablet [id number] Yes TAKE 1 TABLET (5 MG TOTAL) BY MOUTH DAILY. Worthy Rancher B, FNP Taking Active   atorvastatin (LIPITOR) 40 MG tablet [id number] Yes Take 1 tablet (40 mg total) by mouth at bedtime. Wanda Plump, MD Taking Active   co-enzyme Q-10 30 MG capsule [id number] Yes Take 30 mg by mouth daily. [provider] Taking Active   fexofenadine (ALLEGRA) 180 MG tablet [id number] Yes Take 180 mg by mouth daily. OTC [provider] Taking Active   fluticasone (FLONASE) 50 MCG/ACT nasal spray [id number] Yes Place 2 sprays into the nose daily. [provider] Taking Active   potassium chloride SA (KLOR-CON M20) 20 MEQ tablet [id number] Yes TAKE 1 TABLET BY MOUTH EVERY DAY Wanda Plump, MD Taking Active   triamterene-hydrochlorothiazide [facility]) 37.5-25 MG tablet [id number] Yes Take 1 tablet by mouth daily. Wanda Plump, MD Taking Active               Assessment/Plan:  Assessment/Plan: Hypertension - blood pressure was < 140/90 in office today.  - Reviewed appropriate administration of medication regimen - Reviewed appropriate home BP monitoring technique (avoid caffeine, smoking, and exercise for 30 minutes before checking, rest for at least 5 minutes before taking BP, sit with feet flat on the floor and back against a hard surface, uncross legs, and rest arm on flat surface) - Reviewed hypertension classifications and provided printout. This should help patient regarding what the green, yellow, orange and red zones are on her  blood pressure monitor (green is < 120/80; lower yellow is 120 to 130/ <80 and upper yellow is < 130 to 140/ 80 to 90; orange is 140 to 160 / 90 to 120 and red is > 160 / 120 - Recommended she continue to check blood pressure 2 to 3 times per week, document, and provide at next provider visit - Reviewed dietary modifications, such as reduced salt intake, focus on whole grains, vegetables, lean proteins - Reviewed goal of 150 minutes of moderate intensity physical activity weekly - Recommend continue amlodipine and triamterene - hydrochlorothiazide   - Called hematology clinic regarding labs from 03/12/2023 since she has not received a phone call about results yet.Brandon Melnick, NP is out of office on maternity leave. Office contact recommended I sent to Lucilla Edin, NP. Forwarded request to her.  Henrene Pastor, PharmD Clinical Pharmacist [facility]

## 2023-04-16 NOTE — Addendum Note (Signed)
Addended by: Henrene Pastor B on: 04/16/2023 03:03 PM   Modules accepted: Level of Service

## 2023-05-01 ENCOUNTER — Encounter: Payer: Self-pay | Admitting: Medical Oncology

## 2023-05-01 ENCOUNTER — Inpatient Hospital Stay: Payer: Medicare Other | Attending: Hematology & Oncology | Admitting: Medical Oncology

## 2023-05-01 ENCOUNTER — Other Ambulatory Visit: Payer: Self-pay

## 2023-05-01 ENCOUNTER — Other Ambulatory Visit: Payer: Self-pay | Admitting: Internal Medicine

## 2023-05-01 VITALS — BP 127/81 | HR 63 | Temp 98.1°F | Resp 18 | Ht 61.0 in | Wt 157.3 lb

## 2023-05-01 DIAGNOSIS — D509 Iron deficiency anemia, unspecified: Secondary | ICD-10-CM

## 2023-05-01 DIAGNOSIS — D75839 Thrombocytosis, unspecified: Secondary | ICD-10-CM | POA: Insufficient documentation

## 2023-05-01 DIAGNOSIS — Z85850 Personal history of malignant neoplasm of thyroid: Secondary | ICD-10-CM | POA: Diagnosis not present

## 2023-05-01 DIAGNOSIS — D473 Essential (hemorrhagic) thrombocythemia: Secondary | ICD-10-CM | POA: Insufficient documentation

## 2023-05-01 NOTE — Progress Notes (Signed)
Hematology and Oncology Follow Up Visit  Virginia Cox [id number] 08-24-1945 77 y.o. 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 is back for follow-up for mild thrombocytosis. According to her initial consult note, she has had these symptoms for the past 3 years. Labs at her initial visit were: platelets 522, Hgb 14.1, MCV 94 and WBC count 7.7. She also was found to have an elevated Von Willebrand panel but was not found to have the disease. Her iron studied showed a iron saturation of 17%, ferritin of 40. 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 She therefor has Essential Thrombocythemia.   She is doing well overall. She has no concerns other than her labs.  She denies any chest pain, SOB, rash, headache, bleeding.  Appetite is good and weight is stable     Wt Readings from Last 3 Encounters:  05/01/23 157 lb 4.8 oz (71.4 kg)  03/12/23 157 lb 1.3 oz (71.3 kg)  02/25/23 157 lb 4 oz (71.3 kg)     Medications:   Current Outpatient Medications:    amLODipine (NORVASC) 5 MG tablet, TAKE 1 TABLET (5 MG TOTAL) BY MOUTH DAILY., Disp: 90 tablet, Rfl: 2   aspirin EC 81 MG tablet, Take 1 tablet (81 mg total) by mouth daily. Swallow whole., Disp: 30 tablet, Rfl: 12   atorvastatin (LIPITOR) 40 MG tablet, Take 1 tablet (40 mg total) by mouth at bedtime., Disp: 90 tablet, Rfl: 0   co-enzyme Q-10 30 MG capsule, Take 30 mg by mouth daily., Disp: , Rfl:    fexofenadine (ALLEGRA) 180 MG tablet, Take 180 mg by mouth daily. OTC, Disp: , Rfl:    fluticasone (FLONASE) 50 MCG/ACT nasal spray, Place 2 sprays  into the nose daily., Disp: , Rfl:    folic acid (FOLVITE) 1 MG tablet, Take 1 tablet (1 mg total) by mouth daily., Disp: 30 tablet, Rfl: 6   hydroxyurea (HYDREA) 500 MG capsule, Take 1 capsule (500 mg total) by mouth daily. May take with food to minimize GI side effects., Disp: 30 capsule, Rfl: 6   potassium chloride SA (KLOR-CON M20) 20 MEQ tablet, Take 1 tablet (20 mEq total) by mouth daily., Disp: 90 tablet, Rfl: 3   triamterene-hydrochlorothiazide (MAXZIDE-25) 37.5-25 MG tablet, Take 1 tablet by mouth daily., Disp: 90 tablet, Rfl: 3  Allergies: No Known Allergies  Past Medical History, Surgical history, Social history, and Family History were reviewed and updated.  Review of Systems: As stated above in HPI   Physical Exam:  height is 5\' 1"  (1.549 m) and weight is 157 lb 4.8 oz (71.4 kg). Her oral temperature is 98.1 F (36.7 C). Her blood pressure is 127/81 and her pulse is 63. Her respiration is 18 and oxygen saturation is 100%.   Physical Exam General: NAD Cardiovascular: regular rate and rhythm Pulmonary: clear ant fields Abdomen: soft, nontender, + bowel sounds. No palpable splenomegaly  GU: no suprapubic tenderness Extremities: no edema, no joint deformities Skin: no rashes Neurological: Weakness but otherwise nonfocal  Lab Results  Component Value Date   WBC 7.7 03/12/2023   HGB 14.1 03/12/2023   HCT 42.4 03/12/2023   MCV 94.0 03/12/2023   PLT 522 (H) 03/12/2023     Chemistry      Component Value Date/Time   NA 142 03/12/2023 0832   K 3.6 03/12/2023 0832   CL 103 03/12/2023 0832   CO2 33 (H) 03/12/2023 0832   BUN 20 03/12/2023 0832   CREATININE 1.04 (H) 03/12/2023 0832   CREATININE 1.08 (H) 02/23/2022 1414      Component Value Date/Time   CALCIUM 9.8 03/12/2023 0832   ALKPHOS 88 03/12/2023 0832   AST 18 03/12/2023 0832   ALT 16 03/12/2023 0832   BILITOT 0.5 03/12/2023 0832      Assessment and Plan- Patient is a 77 y.o. female with High Risk Jak 2  positive Essential Thrombocythemia with no history of arterial or venous thrombus. She is considered high risk given her age and JAK2 mutation. She will be started on Hydrea 500 mg once daily along with folic acid and an 81 mg asa.     Disposition: Medications sent to pharmacy RTC 1 month APP, labs ( CBC w/, CMP, ferritin, iron)-[place]   Clent Jacks PA-C 10/21/202410:24 AM

## 2023-05-06 MED ORDER — FOLIC ACID 1 MG PO TABS
1.0000 mg | ORAL_TABLET | Freq: Every day | ORAL | 6 refills | Status: AC
Start: 2023-05-06 — End: ?

## 2023-05-06 MED ORDER — HYDROXYUREA 500 MG PO CAPS
500.0000 mg | ORAL_CAPSULE | Freq: Every day | ORAL | 6 refills | Status: DC
Start: 2023-05-06 — End: 2024-02-28

## 2023-05-06 MED ORDER — ASPIRIN 81 MG PO TBEC
81.0000 mg | DELAYED_RELEASE_TABLET | Freq: Every day | ORAL | 12 refills | Status: AC
Start: 2023-05-06 — End: ?

## 2023-05-15 ENCOUNTER — Other Ambulatory Visit: Payer: Self-pay | Admitting: Internal Medicine

## 2023-07-22 ENCOUNTER — Encounter: Payer: Self-pay | Admitting: Family Medicine

## 2023-07-22 ENCOUNTER — Ambulatory Visit (INDEPENDENT_AMBULATORY_CARE_PROVIDER_SITE_OTHER): Payer: Medicare Other | Admitting: Family Medicine

## 2023-07-22 ENCOUNTER — Ambulatory Visit: Payer: Self-pay | Admitting: Internal Medicine

## 2023-07-22 VITALS — BP 138/82 | HR 69 | Ht 61.0 in | Wt 157.6 lb

## 2023-07-22 DIAGNOSIS — J019 Acute sinusitis, unspecified: Secondary | ICD-10-CM

## 2023-07-22 DIAGNOSIS — B9689 Other specified bacterial agents as the cause of diseases classified elsewhere: Secondary | ICD-10-CM

## 2023-07-22 MED ORDER — AMOXICILLIN-POT CLAVULANATE 875-125 MG PO TABS
1.0000 | ORAL_TABLET | Freq: Two times a day (BID) | ORAL | 0 refills | Status: AC
Start: 2023-07-22 — End: 2023-07-29

## 2023-07-22 NOTE — Progress Notes (Signed)
 Acute Office Visit  Subjective:     Patient ID: Virginia Cox, female    DOB: [birth year], 78 y.o.   MRN: [id number]    Patient is in today for feeling off, recent cold symptoms, worried about her blood pressure.      Discussed the use of AI scribe software for clinical note transcription with the patient, who gave verbal consent to proceed.  History of Present Illness   The patient, with a history of hypertension, has been feeling unwell for approximately two weeks, with symptoms suggestive of an upper respiratory tract infection. They report a sensation of high blood pressure, confirmed by one home reading of 180/88. Accompanying symptoms include body aches, a sensation of generalized malaise, and a dry cough. The patient has been producing mucus, which they have been expectorating. They also report occasional dizziness.  The patient has been experiencing mild sinus pressure and occasional ear discomfort, particularly on the right side.  The patient has been managing their symptoms with Coricidin and has taken two COVID-19 tests, both of which were negative. They have not been taking any other over-the-counter cough or cold medications.            ROS All review of systems negative except what is listed in the HPI       Objective:    BP 138/82 (BP Location: Right Arm, Patient Position: Sitting, Cuff Size: Normal)   Pulse 69   Ht 5' 1 (1.549 m)   Wt 157 lb 9.6 oz (71.5 kg)   SpO2 100%   BMI 29.78 kg/m    Physical Exam Vitals reviewed.  Constitutional:      Appearance: Normal appearance.  HENT:     Head: Normocephalic and atraumatic.     Left Ear: Tympanic membrane normal.     Ears:     Comments: Right TM mild effusion     Nose: Congestion and rhinorrhea present.     Mouth/Throat:     Pharynx: No oropharyngeal exudate or posterior oropharyngeal erythema.  Eyes:     Conjunctiva/sclera: Conjunctivae normal.  Cardiovascular:     Rate and Rhythm:  Normal rate and regular rhythm.     Heart sounds: Normal heart sounds.  Pulmonary:     Effort: Pulmonary effort is normal.     Breath sounds: Normal breath sounds.  Musculoskeletal:     Cervical back: Normal range of motion and neck supple. No tenderness.  Lymphadenopathy:     Cervical: No cervical adenopathy.  Skin:    General: Skin is warm and dry.  Neurological:     Mental Status: She is alert and oriented to person, place, and time.  Psychiatric:        Mood and Affect: Mood normal.        Behavior: Behavior normal.        Thought Content: Thought content normal.        Judgment: Judgment normal.      No results found for any visits on 07/22/23.      Assessment & Plan:   Problem List Items Addressed This Visit   None Visit Diagnoses       Acute bacterial rhinosinusitis    -  Primary   Relevant Medications   amoxicillin -clavulanate (AUGMENTIN ) 875-125 MG tablet     Given duration of your symptoms, go ahead and start Augmentin , antibiotics (take with food and water, consider adding a mid-day probiotic for gut health) Continue supportive measures including rest, hydration, humidifier use, steam showers,  warm compresses to sinuses, warm liquids with lemon and honey, and over-the-counter cough, cold, and analgesics as needed.   Please contact office for follow-up if symptoms do not improve or worsen. Seek emergency care if symptoms become severe.   Meds ordered this encounter  Medications   amoxicillin -clavulanate (AUGMENTIN ) 875-125 MG tablet    Sig: Take 1 tablet by mouth 2 (two) times daily for 7 days.    Dispense:  14 tablet    Refill:  0    Supervising Provider:   DOMENICA BLACKBIRD A [[id number]]    Return if symptoms worsen or fail to improve.  Waddell KATHEE Mon, NP

## 2023-07-22 NOTE — Telephone Encounter (Signed)
 Pt has an appt today with Hyman Hopes, NP

## 2023-07-22 NOTE — Patient Instructions (Addendum)
 Given duration of your symptoms, go ahead and start Augmentin , antibiotics (take with food and water, consider adding a mid-day probiotic for gut health) Continue supportive measures including rest, hydration, humidifier use, steam showers, warm compresses to sinuses, warm liquids with lemon and honey, and over-the-counter cough, cold, and analgesics as needed.   Please contact office for follow-up if symptoms do not improve or worsen. Seek emergency care if symptoms become severe.    Over the counter medications that may be helpful for symptoms:  Guaifenesin 1200 mg extended release tabs twice daily, with plenty of water For cough and congestion Brand name: Mucinex   Oxymetazoline nasal spray each morning, one spray in each nostril, for NO MORE THAN 3 days  For nasal and sinus congestion Brand name: Afrin Saline nasal spray or Saline Nasal Irrigation (Netti Pot, etc) 3-5 times a day For nasal and sinus congestion Brand names: Ocean or AYR Fluticasone nasal spray OR Mometasone nasal spray OR Triamcinolone Acetonide nasal spray - follow directions on the packaging For nasal and sinus congestion Brand name: Flonase, Nasonex, Nasacort Warm salt water gargles  For sore throat Every few hours as needed Alternate ibuprofen 400-600 mg and acetaminophen 1000 mg every 6 hours For fever, body aches, headache Brand names: Motrin or Advil and Tylenol Dextromethorphan 12-hour cough version 30 mg every 12 hours  For cough Brand name: Delsym Stop all other cold medications for now (Nyquil, Dayquil, Tylenol Cold, Theraflu, etc) and other non-prescription cough/cold preparations. Many of these have the same ingredients listed above and could cause an overdose of medication.   Herbal treatments that have been shown to be helpful in some patients include: Vitamin C 1000 mg per day Zinc 100 mg per day Quercetin 25-500 mg twice a day Melatonin 5-10mg  at bedtime Honey Green Tea  General  Instructions Allow your body to rest Drink PLENTY of fluids Typically, we are the most contagious 1-2 days before symptoms start through the first 2-3 days of most severe symptoms. Per CDC guidelines, you can return to school/work when symptoms have started to improve and you have been fever-free for 24 hours. However, recommend you continue extra precautions for the following 5 days (frequent hand hygiene, masking, covering coughs/sneezes, minimize exposure to immunocompromised individuals, etc).  If you develop severe shortness of breath, uncontrolled fevers, coughing up blood, confusion, chest pain, or signs of dehydration (such as significantly decreased urine amounts or dizziness with standing) please go to the nearest ER.

## 2023-07-22 NOTE — Telephone Encounter (Signed)
  Chief Complaint: High BP Symptoms: Dizziness Frequency: Ongoing times 2 weeks Pertinent Negatives: Patient denies CP, numbness, HA, N/V Disposition: [] ED /[] Urgent Care (no appt availability in office) / [x] Appointment(In office/virtual)/ []  [place] [facility]/ [] Home Care/ [] Refused Recommended Disposition /[] [place] [facility]/ []  Follow-up with PCP Additional Notes: Pt reports she has been noticing elevated BP readings for the last 2 weeks that have been accompanied with dizziness which has been constant but varying in intensity. Pt reports sometimes when ambulating she feels as though she has to catch the wall to get her balance, denies recent falls. Pt denies CP, numbness, HA, N/V, pain. Pt reports she currently takes 2 BP medications and has been taking them as prescribed. She was unsure of the names but it appears per her chart review she is taking Amlodipine  5 mg daily and Maxzide-25 once daily. OV scheduled for today, pt advised to call back for further concerns. Pt verbalized understanding and agrees to plan.   Copied from CRM [id number]. Topic: Clinical - Red Word Triage >> Jul 22, 2023  9:44 AM Eleanor C wrote: [facility] that prompted transfer to Nurse Triage: patient's blood pressure keeps going up, most recently it was 165/83. Patient's medicine generally keeps blood pressure under control. At one point was 188/84. On two blood pressure medications Reason for Disposition  Systolic BP  >= 160 OR Diastolic >= 100  Answer Assessment - Initial Assessment Questions 1. BLOOD PRESSURE: What is the blood pressure? Did you take at least two measurements 5 minutes apart?     165/83 2. ONSET: When did you take your blood pressure?     0945 3. HOW: How did you take your blood pressure? (e.g., automatic home BP monitor, visiting nurse)     Home wrist monitor 4. HISTORY: Do you have a history of high blood pressure?     Yes 5. MEDICINES: Are you taking any medicines for  blood pressure? Have you missed any doses recently?     Takes 2 BP medications, pt unsure of name 6. OTHER SYMPTOMS: Do you have any symptoms? (e.g., blurred vision, chest pain, difficulty breathing, headache, weakness)     Lightheaded, all the time sometimes she reports feeling unsteady have to catch hold of the wall  Protocols used: Blood Pressure - High-A-AH

## 2023-07-24 ENCOUNTER — Other Ambulatory Visit: Payer: Self-pay | Admitting: Family

## 2023-07-29 NOTE — Telephone Encounter (Signed)
 Copied from CRM [id number]. Topic: Clinical - Prescription Issue >> Jul 29, 2023  1:31 PM Leotis ORN wrote: Reason for CRM: pt is out of  amLODipine  (NORVASC ) 5 MG tablet [Pharmacy Med Name: AMLODIPINE  BESYLATE 5 MG TAB]   stated the pharmacy called to let her know they have not gotten it.   Callback [phone]

## 2023-08-02 ENCOUNTER — Inpatient Hospital Stay (HOSPITAL_BASED_OUTPATIENT_CLINIC_OR_DEPARTMENT_OTHER): Payer: Medicare Other | Admitting: Family

## 2023-08-02 ENCOUNTER — Encounter: Payer: Self-pay | Admitting: Family

## 2023-08-02 ENCOUNTER — Inpatient Hospital Stay: Payer: Medicare Other | Attending: Hematology & Oncology

## 2023-08-02 VITALS — BP 150/70 | HR 60 | Temp 98.3°F | Resp 18 | Ht 61.0 in | Wt 159.0 lb

## 2023-08-02 DIAGNOSIS — D473 Essential (hemorrhagic) thrombocythemia: Secondary | ICD-10-CM | POA: Insufficient documentation

## 2023-08-02 DIAGNOSIS — D75839 Thrombocytosis, unspecified: Secondary | ICD-10-CM | POA: Insufficient documentation

## 2023-08-02 DIAGNOSIS — Z7964 Long term (current) use of myelosuppressive agent: Secondary | ICD-10-CM | POA: Diagnosis not present

## 2023-08-02 DIAGNOSIS — D509 Iron deficiency anemia, unspecified: Secondary | ICD-10-CM | POA: Diagnosis not present

## 2023-08-02 DIAGNOSIS — Z85850 Personal history of malignant neoplasm of thyroid: Secondary | ICD-10-CM | POA: Diagnosis not present

## 2023-08-02 LAB — CBC WITH DIFFERENTIAL (CANCER CENTER ONLY)
Abs Immature Granulocytes: 0.03 10*3/uL (ref 0.00–0.07)
Basophils Absolute: 0 10*3/uL (ref 0.0–0.1)
Basophils Relative: 0 %
Eosinophils Absolute: 0.1 10*3/uL (ref 0.0–0.5)
Eosinophils Relative: 2 %
HCT: 42.2 % (ref 36.0–46.0)
Hemoglobin: 14.1 g/dL (ref 12.0–15.0)
Immature Granulocytes: 0 %
Lymphocytes Relative: 29 %
Lymphs Abs: 2.3 10*3/uL (ref 0.7–4.0)
MCH: 30.9 pg (ref 26.0–34.0)
MCHC: 33.4 g/dL (ref 30.0–36.0)
MCV: 92.5 fL (ref 80.0–100.0)
Monocytes Absolute: 0.6 10*3/uL (ref 0.1–1.0)
Monocytes Relative: 8 %
Neutro Abs: 4.8 10*3/uL (ref 1.7–7.7)
Neutrophils Relative %: 61 %
Platelet Count: 560 10*3/uL — ABNORMAL HIGH (ref 150–400)
RBC: 4.56 MIL/uL (ref 3.87–5.11)
RDW: 13.2 % (ref 11.5–15.5)
WBC Count: 7.9 10*3/uL (ref 4.0–10.5)
nRBC: 0 % (ref 0.0–0.2)

## 2023-08-02 LAB — CMP (CANCER CENTER ONLY)
ALT: 15 U/L (ref 0–44)
AST: 21 U/L (ref 15–41)
Albumin: 4.5 g/dL (ref 3.5–5.0)
Alkaline Phosphatase: 97 U/L (ref 38–126)
Anion gap: 8 (ref 5–15)
BUN: 22 mg/dL (ref 8–23)
CO2: 31 mmol/L (ref 22–32)
Calcium: 9.5 mg/dL (ref 8.9–10.3)
Chloride: 101 mmol/L (ref 98–111)
Creatinine: 0.88 mg/dL (ref 0.44–1.00)
GFR, Estimated: >60 mL/min (ref >60)
Glucose, Bld: 90 mg/dL (ref 70–99)
Potassium: 3.2 mmol/L — ABNORMAL LOW (ref 3.5–5.1)
Sodium: 140 mmol/L (ref 135–145)
Total Bilirubin: 0.4 mg/dL (ref 0.0–1.2)
Total Protein: 7.5 g/dL (ref 6.5–8.1)

## 2023-08-02 LAB — FERRITIN: Ferritin: 34 ng/mL (ref 11–307)

## 2023-08-02 NOTE — Progress Notes (Signed)
Hematology and Oncology Follow Up Visit  Virginia Cox [id number] 08-01-1945 78 y.o. 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 She is doing well getting over a recent sinus infection.  She still has a little nasal congestion and dry cough.  No fever, chills, n/v, rash, dizziness, SOB, chest pain, palpitations, abdominal pain or changes in bowel or bladder habits.  Platelets are 560, WBC count 7.9, Hgb 14.1 and MCV 92.  She has only been taking the folic acid daily and occasionally the baby aspirin.  No swelling, tenderness, numbness or tingling in her extremities at this time.  No falls or syncope reported.  Appetite and hydration are good. Weight is stable at 159 lbs.   ECOG Performance Status: 1 - Symptomatic but completely ambulatory  Medications:  Allergies as of 08/02/2023   No Known Allergies      Medication List        Accurate as of August 02, 2023  2:58 PM. If you have any questions, ask your nurse or doctor.          amLODipine 5 MG tablet Commonly known as: NORVASC Take 1 tablet (5 mg total) by mouth daily.   aspirin EC 81 MG tablet Take 1 tablet (81 mg total) by mouth daily. Swallow whole.   atorvastatin 40 MG tablet Commonly known as: LIPITOR Take 1 tablet (40 mg total) by mouth at bedtime.   co-enzyme Q-10 30 MG capsule Take 30 mg by mouth daily.   fexofenadine 180 MG tablet Commonly known as: ALLEGRA Take 180 mg by mouth daily. OTC   fluticasone 50 MCG/ACT nasal spray Commonly known as: FLONASE Place 2 sprays into the nose daily.   folic acid 1 MG tablet Commonly known as: FOLVITE Take 1 tablet (1 mg total) by mouth daily.   hydroxyurea 500 MG  capsule Commonly known as: HYDREA Take 1 capsule (500 mg total) by mouth daily. May take with food to minimize GI side effects.   potassium chloride SA 20 MEQ tablet Commonly known as: Klor-Con M20 Take 1 tablet (20 mEq total) by mouth daily.   triamterene-hydrochlorothiazide 37.5-25 MG tablet Commonly known as: MAXZIDE-25 Take 1 tablet by mouth daily.        Allergies: No Known Allergies  Past Medical History, Surgical history, Social history, and Family History were reviewed and updated.  Review of Systems: All other 10 point review of systems is negative.   Physical Exam:  height is 5\' 1"  (1.549 m) and weight is 159 lb (72.1 kg). Her oral temperature is 98.3 F (36.8 C). Her blood pressure is 150/70 (abnormal) and her pulse is 60. Her respiration is 18 and oxygen saturation is 100%.   Wt Readings from Last 3 Encounters:  08/02/23 159 lb (72.1 kg)  07/22/23 157 lb 9.6 oz (71.5 kg)  05/01/23 157 lb 4.8 oz (71.4 kg)    Ocular: Sclerae unicteric, pupils equal, round and reactive to light Ear-nose-throat: Oropharynx clear, dentition fair Lymphatic: No cervical or supraclavicular adenopathy Lungs no rales or rhonchi, good excursion bilaterally Heart regular rate and rhythm, no murmur appreciated Abd soft, nontender, positive bowel sounds MSK no focal spinal tenderness, no joint  edema Neuro: non-focal, well-oriented, appropriate affect Breasts: Deferred   Lab Results  Component Value Date   WBC 7.9 08/02/2023   HGB 14.1 08/02/2023   HCT 42.2 08/02/2023   MCV 92.5 08/02/2023   PLT 560 (H) 08/02/2023   Lab Results  Component Value Date   FERRITIN 40 03/12/2023   IRON 54 03/12/2023   TIBC 318 03/12/2023   UIBC 264 03/12/2023   IRONPCTSAT 17 03/12/2023   Lab Results  Component Value Date   RBC 4.56 08/02/2023   No results found for: "KPAFRELGTCHN", "LAMBDASER", "KAPLAMBRATIO" No results found for: "IGGSERUM", "IGA", "IGMSERUM" No results found for:  "TOTALPROTELP", "ALBUMINELP", "A1GS", "A2GS", "BETS", "BETA2SER", "GAMS", "MSPIKE", "SPEI"   Chemistry      Component Value Date/Time   NA 140 08/02/2023 1422   K 3.2 (L) 08/02/2023 1422   CL 101 08/02/2023 1422   CO2 31 08/02/2023 1422   BUN 22 08/02/2023 1422   CREATININE 0.88 08/02/2023 1422   CREATININE 1.08 (H) 02/23/2022 1414      Component Value Date/Time   CALCIUM 9.5 08/02/2023 1422   ALKPHOS 97 08/02/2023 1422   AST 21 08/02/2023 1422   ALT 15 08/02/2023 1422   BILITOT 0.4 08/02/2023 1422       Impression and Plan: Virginia Cox is a 78 yo female with High Risk Jak 2 positive Essential Thrombocythemia with no history of arterial or venous thrombus. She is considered high risk given her age and JAK2 mutation.  She will start taking the Hydrea 500 mg PO daily along with daily baby aspirin and folic acid.  Follow-up in 2 months.   Eileen Stanford, NP 1/17/20252:58 PM

## 2023-08-05 LAB — IRON AND IRON BINDING CAPACITY (CC-WL,HP ONLY)
Iron: 79 ug/dL (ref 28–170)
Saturation Ratios: 25 % (ref 10.4–31.8)
TIBC: 322 ug/dL (ref 250–450)
UIBC: 243 ug/dL (ref 148–442)

## 2023-08-22 ENCOUNTER — Ambulatory Visit: Payer: Medicare Other

## 2023-08-22 VITALS — Ht 61.0 in | Wt 159.0 lb

## 2023-08-22 DIAGNOSIS — Z0000 Encounter for general adult medical examination without abnormal findings: Secondary | ICD-10-CM | POA: Diagnosis not present

## 2023-08-22 NOTE — Progress Notes (Signed)
 Subjective:   Virginia Cox is a 78 y.o. female who presents for Medicare Annual (Subsequent) preventive examination.  Visit Complete: Virtual I connected with  Virginia Cox on 08/22/23 by a audio enabled telemedicine application and verified that I am speaking with the correct person using two identifiers.  Patient Location: Home  Provider Location: Home Office  I discussed the limitations of evaluation and management by telemedicine. The patient expressed understanding and agreed to proceed.  Vital Signs: Because this visit was a virtual/telehealth visit, some criteria may be missing or patient reported. Any vitals not documented were not able to be obtained and vitals that have been documented are patient reported.    Cardiac Risk Factors include: advanced age (>39men, >35 women);hypertension     Objective:    Today's Vitals   08/22/23 1434 08/22/23 1435  Weight: 159 lb (72.1 kg)   Height: 5' 1 (1.549 m)   PainSc:  0-No pain   Body mass index is 30.04 kg/m.     08/22/2023    2:40 PM 08/02/2023    2:44 PM 05/01/2023    2:18 PM 03/12/2023    9:20 AM 08/16/2022    9:00 AM 08/06/2021    4:21 PM 07/14/2021    8:25 AM  Advanced Directives  Does Patient Have a Medical Advance Directive? No No No No No No No  Would patient like information on creating a medical advance directive? No - Patient declined No - Patient declined No - Patient declined No - Patient declined No - Patient declined  No - Patient declined    Current Medications (verified) Outpatient Encounter Medications as of 08/22/2023  Medication Sig   amLODipine  (NORVASC ) 5 MG tablet Take 1 tablet (5 mg total) by mouth daily.   aspirin  EC 81 MG tablet Take 1 tablet (81 mg total) by mouth daily. Swallow whole.   atorvastatin  (LIPITOR) 40 MG tablet Take 1 tablet (40 mg total) by mouth at bedtime.   co-enzyme Q-10 30 MG capsule Take 30 mg by mouth daily.   fexofenadine (ALLEGRA) 180 MG tablet Take 180 mg by  mouth daily. OTC   fluticasone (FLONASE) 50 MCG/ACT nasal spray Place 2 sprays into the nose daily.   folic acid  (FOLVITE ) 1 MG tablet Take 1 tablet (1 mg total) by mouth daily.   hydroxyurea  (HYDREA ) 500 MG capsule Take 1 capsule (500 mg total) by mouth daily. May take with food to minimize GI side effects. (Patient not taking: Reported on 08/02/2023)   potassium chloride  SA (KLOR-CON  M20) 20 MEQ tablet Take 1 tablet (20 mEq total) by mouth daily.   triamterene -hydrochlorothiazide (MAXZIDE-25) 37.5-25 MG tablet Take 1 tablet by mouth daily.   No facility-administered encounter medications on file as of 08/22/2023.    Allergies (verified) Patient has no known allergies.   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   Past Surgical History:  Procedure Laterality Date   CESAREAN SECTION     x 1   THYROIDECTOMY  1/11      - papillary microcarcinoma   TUBAL LIGATION     Family History  Problem Relation Age of Onset   Coronary artery disease Father        MI 29s   Hypertension Mother    Coronary artery disease Brother        MI  61   Diabetes Neg Hx    Colon cancer Neg Hx    Breast cancer Neg Hx    Stroke Neg Hx    Social History   Socioeconomic History   Marital status: Divorced    Spouse name: Not on file   Number of children: 3   Years of education: Not on file   Highest education level: Not on file  Occupational History   Occupation: retired  midwife    Occupation: works at [employer] part -time  Tobacco Use   Smoking status: Never   Smokeless tobacco: Never  Vaping Use   Vaping status: Never Used  Substance and Sexual Activity   Alcohol use: No   Drug use: No   Sexual activity: Not on file  Other Topics Concern   Not on file  Social History Narrative   divorced , lives w/ Virginia Cox daughter   3 kids , lost mother 09-2017, she  was 66 y/o, lived w/ her mom all her life   1 daughter lives w/ her   1 daughter in [place]   1 daughter in [place]        Social Drivers of Health   Financial Resource Strain: Low Risk  (08/22/2023)   Overall Financial Resource Strain (CARDIA)    Difficulty of Paying Living Expenses: Not hard at all  Food Insecurity: No Food Insecurity (08/22/2023)   Hunger Vital Sign    Worried About Running Out of Food in the Last Year: Never true    Ran Out of Food in the Last Year: Never true  Transportation Needs: No Transportation Needs (08/22/2023)   PRAPARE - Administrator, Civil Service (Medical): No    Lack of Transportation (Non-Medical): No  Physical Activity: Insufficiently Active (08/22/2023)   Exercise Vital Sign    Days of Exercise per Week: 2 days    Minutes of Exercise per Session: 30 min  Stress: No Stress Concern Present (08/22/2023)   [employer] of Occupational Health - Occupational Stress Questionnaire    Feeling of Stress : Not at all  Social Connections: Socially Isolated (08/22/2023)   Social Connection and Isolation Panel [NHANES]    Frequency of Communication with Friends and Family: More than three times a week    Frequency of Social Gatherings with Friends and Family: More than three times a week    Attends Religious Services: Never    Database Administrator or Organizations: No    Attends Banker Meetings: Never    Marital Status: Widowed    Tobacco Counseling Counseling given: Not Answered   Clinical Intake:  Pre-visit preparation completed: Yes  Pain : No/denies pain Pain Score: 0-No pain     BMI - recorded: 30.04 Nutritional Status: BMI > 30  Obese Nutritional Risks: None Diabetes: No  How often do you need to have someone help you when you read instructions, pamphlets, or other written materials from your doctor or pharmacy?: 1 - Never  Interpreter Needed?: No  Information entered by :: Rojelio Blush LPN   Activities of  Daily Living    08/22/2023    2:39 PM  In your present state of health, do you have any difficulty performing the following activities:  Hearing? 0  Vision? 0  Difficulty concentrating or making decisions? 0  Walking or climbing stairs? 0  Dressing or bathing? 0  Doing errands, shopping? 0  Preparing Food and eating ? N  Using the Toilet? N  In the  past six months, have you accidently leaked urine? N  Do you have problems with loss of bowel control? N  Managing your Medications? N  Managing your Finances? N  Housekeeping or managing your Housekeeping? N    Patient Care Team: Amon Aloysius BRAVO, MD as PCP - General Vernetta Berg, MD as Consulting Physician (General Surgery)  Indicate any recent Medical Services you may have received from other than [facility] providers in the past year (date may be approximate).     Assessment:   This is a routine wellness examination for Alpa.  Hearing/Vision screen Hearing Screening - Comments:: Denies hearing difficulties   Vision Screening - Comments:: Wears rx glasses - up to date with routine eye exams with  [facility]   Goals Addressed               This Visit's Progress     Remain Active (pt-stated)         Depression Screen    08/22/2023    2:38 PM 02/25/2023    2:00 PM 08/27/2022    2:07 PM 08/16/2022    9:01 AM 02/23/2022    1:46 PM 08/07/2021    1:29 PM 07/14/2021    8:28 AM  PHQ 2/9 Scores  PHQ - 2 Score 0 1 1 1  0 0 0    Fall Risk    08/22/2023    2:39 PM 02/25/2023    1:59 PM 08/27/2022    2:07 PM 08/16/2022    9:00 AM 02/23/2022    1:46 PM  Fall Risk   Falls in the past year? 0 0 0 0 0  Number falls in past yr: 0 0 0 0 0  Injury with Fall? 0 0 0 0 0  Risk for fall due to : No Fall Risks   No Fall Risks No Fall Risks  Follow up Falls prevention discussed;Falls evaluation completed Falls evaluation completed Falls evaluation completed Falls evaluation completed Falls evaluation completed    MEDICARE RISK AT  HOME: Medicare Risk at Home Any stairs in or around the home?: No If so, are there any without handrails?: No Home free of loose throw rugs in walkways, pet beds, electrical cords, etc?: Yes Adequate lighting in your home to reduce risk of falls?: Yes Life alert?: No Use of a cane, walker or w/c?: No Grab bars in the bathroom?: No Shower chair or bench in shower?: No Elevated toilet seat or a handicapped toilet?: No  TIMED UP AND GO:  Was the test performed?  No    Cognitive Function:        08/22/2023    2:40 PM 08/16/2022    9:06 AM  6CIT Screen  What Year? 0 points 0 points  What month? 0 points 0 points  What time? 0 points 0 points  Count back from 20 0 points 0 points  Months in reverse 0 points 0 points  Repeat phrase 0 points 4 points  Total Score 0 points 4 points    Immunizations Immunization History  Administered Date(s) Administered   PFIZER(Purple Top)SARS-COV-2 Vaccination 09/19/2019, 10/10/2019, 05/14/2020   Tdap 01/28/2012      Flu Vaccine status: Declined, Education has been provided regarding the importance of this vaccine but patient still declined. Advised may receive this vaccine at local pharmacy or Health Dept. Aware to provide a copy of the vaccination record if obtained from local pharmacy or Health Dept. Verbalized acceptance and understanding.   Screening Tests Health Maintenance  Topic  Date Due   INFLUENZA VACCINE  10/14/2023 (Originally 02/14/2023)   Medicare Annual Wellness (AWV)  08/21/2024   Hepatitis C Screening  Completed   HPV VACCINES  Aged Out   DTaP/Tdap/Td  Discontinued   Pneumonia Vaccine 63+ Years old  Discontinued   MAMMOGRAM  Discontinued   DEXA SCAN  Discontinued   Colonoscopy  Discontinued   COVID-19 Vaccine  Discontinued   Zoster Vaccines- Shingrix  Discontinued    Health Maintenance  There are no preventive care reminders to display for this patient.    Additional Screening:  Hepatitis C Screening: does  qualify; Completed 06/27/15  Vision Screening: Recommended annual ophthalmology exams for early detection of glaucoma and other disorders of the eye. Is the patient up to date with their annual eye exam?  Yes  Who is the provider or what is the name of the office in which the patient attends annual eye exams? [facility] If pt is not established with a provider, would they like to be referred to a provider to establish care? No .   Dental Screening: Recommended annual dental exams for proper oral hygiene    Community Resource Referral / Chronic Care Management:  CRR required this visit?  No   CCM required this visit?  No     Plan:     I have personally reviewed and noted the following in the patient's chart:   Medical and social history Use of alcohol, tobacco or illicit drugs  Current medications and supplements including opioid prescriptions. Patient is not currently taking opioid prescriptions. Functional ability and status Nutritional status Physical activity Advanced directives List of other physicians Hospitalizations, surgeries, and ER visits in previous 12 months Vitals Screenings to include cognitive, depression, and falls Referrals and appointments  In addition, I have reviewed and discussed with patient certain preventive protocols, quality metrics, and best practice recommendations. A written personalized care plan for preventive services as well as general preventive health recommendations were provided to patient.     Rojelio LELON Blush, LPN   01/15/7973   After Visit Summary: (MyChart) Due to this being a telephonic visit, the after visit summary with patients personalized plan was offered to patient via MyChart   Nurse Notes: None

## 2023-08-22 NOTE — Patient Instructions (Addendum)
 Virginia Cox , Thank you for taking time to come for your Medicare Wellness Visit. I appreciate your ongoing commitment to your health goals. Please review the following plan we discussed and let me know if I can assist you in the future.   Referrals/Orders/Follow-Ups/Clinician Recommendations: Remain Active  This is a list of the screening recommended for you and due dates:  Health Maintenance  Topic Date Due   Flu Shot  10/14/2023*   Medicare Annual Wellness Visit  08/21/2024   Hepatitis C Screening  Completed   HPV Vaccine  Aged Out   DTaP/Tdap/Td vaccine  Discontinued   Pneumonia Vaccine  Discontinued   Mammogram  Discontinued   DEXA scan (bone density measurement)  Discontinued   Colon Cancer Screening  Discontinued   COVID-19 Vaccine  Discontinued   Zoster (Shingles) Vaccine  Discontinued  *Topic was postponed. The date shown is not the original due date.    Advanced directives: (Declined) Advance directive discussed with you today. Even though you declined this today, please call our office should you change your mind, and we can give you the proper paperwork for you to fill out.  Next Medicare Annual Wellness Visit scheduled for next year: Yes

## 2023-10-02 ENCOUNTER — Inpatient Hospital Stay: Payer: Medicare Other | Attending: Hematology & Oncology

## 2023-10-02 ENCOUNTER — Encounter: Payer: Self-pay | Admitting: Hematology & Oncology

## 2023-10-02 ENCOUNTER — Inpatient Hospital Stay (HOSPITAL_BASED_OUTPATIENT_CLINIC_OR_DEPARTMENT_OTHER): Payer: Medicare Other | Admitting: Hematology & Oncology

## 2023-10-02 VITALS — BP 127/78 | HR 65 | Temp 98.7°F | Resp 18 | Ht 61.0 in | Wt 158.0 lb

## 2023-10-02 DIAGNOSIS — Z85850 Personal history of malignant neoplasm of thyroid: Secondary | ICD-10-CM | POA: Insufficient documentation

## 2023-10-02 DIAGNOSIS — D75839 Thrombocytosis, unspecified: Secondary | ICD-10-CM | POA: Diagnosis not present

## 2023-10-02 DIAGNOSIS — D509 Iron deficiency anemia, unspecified: Secondary | ICD-10-CM

## 2023-10-02 DIAGNOSIS — D473 Essential (hemorrhagic) thrombocythemia: Secondary | ICD-10-CM | POA: Diagnosis not present

## 2023-10-02 LAB — CBC WITH DIFFERENTIAL (CANCER CENTER ONLY)
Abs Immature Granulocytes: 0.03 10*3/uL (ref 0.00–0.07)
Basophils Absolute: 0 10*3/uL (ref 0.0–0.1)
Basophils Relative: 0 %
Eosinophils Absolute: 0.1 10*3/uL (ref 0.0–0.5)
Eosinophils Relative: 2 %
HCT: 40.3 % (ref 36.0–46.0)
Hemoglobin: 13.8 g/dL (ref 12.0–15.0)
Immature Granulocytes: 0 %
Lymphocytes Relative: 36 %
Lymphs Abs: 2.9 10*3/uL (ref 0.7–4.0)
MCH: 32.6 pg (ref 26.0–34.0)
MCHC: 34.2 g/dL (ref 30.0–36.0)
MCV: 95.3 fL (ref 80.0–100.0)
Monocytes Absolute: 0.5 10*3/uL (ref 0.1–1.0)
Monocytes Relative: 6 %
Neutro Abs: 4.6 10*3/uL (ref 1.7–7.7)
Neutrophils Relative %: 56 %
Platelet Count: 324 10*3/uL (ref 150–400)
RBC: 4.23 MIL/uL (ref 3.87–5.11)
RDW: 15.1 % (ref 11.5–15.5)
WBC Count: 8.1 10*3/uL (ref 4.0–10.5)
nRBC: 0 % (ref 0.0–0.2)

## 2023-10-02 LAB — CMP (CANCER CENTER ONLY)
ALT: 18 U/L (ref 0–44)
AST: 21 U/L (ref 15–41)
Albumin: 4.6 g/dL (ref 3.5–5.0)
Alkaline Phosphatase: 76 U/L (ref 38–126)
Anion gap: 10 (ref 5–15)
BUN: 18 mg/dL (ref 8–23)
CO2: 30 mmol/L (ref 22–32)
Calcium: 9.4 mg/dL (ref 8.9–10.3)
Chloride: 98 mmol/L (ref 98–111)
Creatinine: 0.99 mg/dL (ref 0.44–1.00)
GFR, Estimated: 59 mL/min — ABNORMAL LOW (ref >60)
Glucose, Bld: 178 mg/dL — ABNORMAL HIGH (ref 70–99)
Potassium: 3.6 mmol/L (ref 3.5–5.1)
Sodium: 138 mmol/L (ref 135–145)
Total Bilirubin: 0.6 mg/dL (ref 0.0–1.2)
Total Protein: 7.4 g/dL (ref 6.5–8.1)

## 2023-10-02 LAB — FERRITIN: Ferritin: 37 ng/mL (ref 11–307)

## 2023-10-02 NOTE — Progress Notes (Signed)
 Hematology and Oncology Follow Up Visit  Virginia Cox [id number] 02-24-1946 78 y.o. 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  She is doing quite well.  This is my first visit with her.  She is so delightful.  I really enjoyed talking with her.  She has had no problems with the Hydrea.  She has had no nausea or vomiting.  She is doing well with the baby aspirin.  She has had no dyspepsia.  There is been no problems with fever.  She has had no issues with COVID.  There is been no problems with bowels or bladder..  She does not want to have any mammogram.  She does not want to have any colonoscopy.  I talked to her about this.  I know that her family doctor is also talked to her about this.  She has had a good appetite.  She has had no headache.  She has had no rashes.  There is been no bleeding.  She has had no problems with pain in her hands or feet.  Overall, I would have to say that her performance status is probably ECOG 1.     Medications:  Allergies as of 10/02/2023   No Known Allergies      Medication List        Accurate as of October 02, 2023  3:15 PM. If you have any questions, ask your nurse or doctor.          amLODipine 5 MG tablet Commonly known as: NORVASC Take 1 tablet (5 mg total) by mouth daily.   aspirin EC 81 MG tablet Take 1 tablet (81 mg total) by mouth daily. Swallow whole.   atorvastatin 40 MG tablet Commonly known as: LIPITOR Take 1 tablet (40 mg total) by mouth at bedtime.   co-enzyme Q-10 30 MG capsule Take 30 mg by mouth daily.   fexofenadine 180 MG tablet Commonly known as: ALLEGRA Take 180 mg by mouth daily. OTC   fluticasone 50 MCG/ACT  nasal spray Commonly known as: FLONASE Place 2 sprays into the nose daily.   folic acid 1 MG tablet Commonly known as: FOLVITE Take 1 tablet (1 mg total) by mouth daily.   hydroxyurea 500 MG capsule Commonly known as: HYDREA Take 1 capsule (500 mg total) by mouth daily. May take with food to minimize GI side effects.   potassium chloride SA 20 MEQ tablet Commonly known as: Klor-Con M20 Take 1 tablet (20 mEq total) by mouth daily.   triamterene-hydrochlorothiazide 37.5-25 MG tablet Commonly known as: MAXZIDE-25 Take 1 tablet by mouth daily.        Allergies: No Known Allergies  Past Medical History, Surgical history, Social history, and Family History were reviewed and updated.  Review of Systems: Review of Systems  Constitutional: Negative.   HENT: Negative.    Eyes: Negative.   Respiratory: Negative.    Cardiovascular: Negative.   Gastrointestinal: Negative.   Genitourinary: Negative.   Musculoskeletal: Negative.   Skin: Negative.   Neurological: Negative.   Endo/Heme/Allergies: Negative.   Psychiatric/Behavioral: Negative.       Physical Exam:  height is 5\' 1"  (1.549  m) and weight is 158 lb (71.7 kg). Her oral temperature is 98.7 F (37.1 C). Her blood pressure is 127/78 and her pulse is 65. Her respiration is 18 and oxygen saturation is 100%.   Wt Readings from Last 3 Encounters:  10/02/23 158 lb (71.7 kg)  08/22/23 159 lb (72.1 kg)  08/02/23 159 lb (72.1 kg)   Physical Exam Vitals reviewed.  HENT:     Head: Normocephalic and atraumatic.  Eyes:     Pupils: Pupils are equal, round, and reactive to light.  Cardiovascular:     Rate and Rhythm: Normal rate and regular rhythm.     Heart sounds: Normal heart sounds.  Pulmonary:     Effort: Pulmonary effort is normal.     Breath sounds: Normal breath sounds.  Abdominal:     General: Bowel sounds are normal.     Palpations: Abdomen is soft.  Musculoskeletal:        General: No tenderness or deformity.  Normal range of motion.     Cervical back: Normal range of motion.  Lymphadenopathy:     Cervical: No cervical adenopathy.  Skin:    General: Skin is warm and dry.     Findings: No erythema or rash.  Neurological:     Mental Status: She is alert and oriented to person, place, and time.  Psychiatric:        Behavior: Behavior normal.        Thought Content: Thought content normal.        Judgment: Judgment normal.      Lab Results  Component Value Date   WBC 8.1 10/02/2023   HGB 13.8 10/02/2023   HCT 40.3 10/02/2023   MCV 95.3 10/02/2023   PLT 324 10/02/2023   Lab Results  Component Value Date   FERRITIN 34 08/02/2023   IRON 79 08/02/2023   TIBC 322 08/02/2023   UIBC 243 08/02/2023   IRONPCTSAT 25 08/02/2023   Lab Results  Component Value Date   RBC 4.23 10/02/2023   No results found for: "KPAFRELGTCHN", "LAMBDASER", "KAPLAMBRATIO" No results found for: "IGGSERUM", "IGA", "IGMSERUM" No results found for: "TOTALPROTELP", "ALBUMINELP", "A1GS", "A2GS", "BETS", "BETA2SER", "GAMS", "MSPIKE", "SPEI"   Chemistry      Component Value Date/Time   NA 138 10/02/2023 1425   K 3.6 10/02/2023 1425   CL 98 10/02/2023 1425   CO2 30 10/02/2023 1425   BUN 18 10/02/2023 1425   CREATININE 0.99 10/02/2023 1425   CREATININE 1.08 (H) 02/23/2022 1414      Component Value Date/Time   CALCIUM 9.4 10/02/2023 1425   ALKPHOS 76 10/02/2023 1425   AST 21 10/02/2023 1425   ALT 18 10/02/2023 1425   BILITOT 0.6 10/02/2023 1425       Impression and Plan: Virginia Cox is a 78 yo female with Essential Thrombocythemia.  She clearly has responded well to the Hydrea.  I am so happy that she is doing well on the Hydrea.  Her platelet counts come down nicely.  For right now, we will go ahead and get her back after [facility] Day.  I know that she should do well.  I told her that if she does have any problems, that she can always give [place] a call.     Josph Macho, MD 3/19/20253:15 PM

## 2023-10-03 LAB — IRON AND IRON BINDING CAPACITY (CC-WL,HP ONLY)
Iron: 63 ug/dL (ref 28–170)
Saturation Ratios: 19 % (ref 10.4–31.8)
TIBC: 335 ug/dL (ref 250–450)
UIBC: 272 ug/dL (ref 148–442)

## 2023-10-04 ENCOUNTER — Inpatient Hospital Stay: Payer: Medicare Other

## 2023-10-04 ENCOUNTER — Ambulatory Visit: Payer: Medicare Other | Admitting: Hematology & Oncology

## 2023-11-13 ENCOUNTER — Ambulatory Visit: Admitting: Internal Medicine

## 2023-11-13 ENCOUNTER — Encounter: Payer: Self-pay | Admitting: Internal Medicine

## 2023-11-13 ENCOUNTER — Ambulatory Visit (HOSPITAL_BASED_OUTPATIENT_CLINIC_OR_DEPARTMENT_OTHER)
Admission: RE | Admit: 2023-11-13 | Discharge: 2023-11-13 | Disposition: A | Discharge status: Home / Self Care | Source: Ambulatory Visit | Attending: Internal Medicine | Admitting: Internal Medicine

## 2023-11-13 VITALS — BP 120/72 | HR 63 | Temp 97.9°F | Resp 16 | Ht 61.0 in | Wt 155.2 lb

## 2023-11-13 DIAGNOSIS — R739 Hyperglycemia, unspecified: Secondary | ICD-10-CM | POA: Diagnosis not present

## 2023-11-13 DIAGNOSIS — R42 Dizziness and giddiness: Secondary | ICD-10-CM | POA: Insufficient documentation

## 2023-11-13 DIAGNOSIS — R079 Chest pain, unspecified: Secondary | ICD-10-CM

## 2023-11-13 DIAGNOSIS — Z2821 Immunization not carried out because of patient refusal: Secondary | ICD-10-CM

## 2023-11-13 NOTE — Progress Notes (Signed)
 Subjective:    Patient ID: Virginia Cox, female    DOB: 1945/08/17, 78 y.o.   MRN: [id number]  DOS:  11/13/2023 Type of visit - description: Acute  Having the symptoms for the last 3 days, described as  "swimmy head" lack of balance. The symptoms increased when she walks, not necessarily when she moves her head. Decrease when she lays down.  No associated nausea or vomiting, no recent diarrhea. When asked about chest pain she admits to a single episode of mid anterior chest pain last week, at rest, lasted about an hour.  No associated shortness of breath, palpitations, diaphoresis or nausea.  Also denies headache, diplopia, double vision or slurred speech. No recent URI or allergies.  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   atorvastatin  (LIPITOR) 40 mg, Oral, Daily at bedtime   co-enzyme Q-10 30 mg, Daily   fexofenadine (ALLEGRA) 180 mg, Daily   fluticasone (FLONASE) 50 MCG/ACT nasal spray 2 sprays, Daily   folic acid  (FOLVITE ) 1 mg, Oral, Daily   hydroxyurea  (HYDREA ) 500 mg, Oral, Daily, May take with food to minimize GI side effects.   potassium chloride  SA (KLOR-CON  M20) 20 MEQ tablet 20 mEq, Oral, Daily   triamterene -hydrochlorothiazide (MAXZIDE-25) 37.5-25 MG tablet 1 tablet, Oral, Daily       Objective:   Physical Exam BP 120/72   Pulse 63   Temp 97.9 F (36.6 C) (Oral)   Resp 16   Ht 5\' 1"  (1.549 m)   Wt 155 lb 4 oz (70.4 kg)   SpO2 95%   BMI 29.33 kg/m  General:   Well developed, NAD,  BMI noted. HEENT:  Normocephalic . Face symmetric, atraumatic.  EOMI Neck: Normal ROM Carotid pulses normal with no bruit Lungs:  CTA B Normal respiratory effort, no intercostal retractions, no accessory muscle use. Heart: RRR,  no murmur.  Lower extremities: no pretibial edema bilaterally  Skin: Not pale. Not jaundice Neurologic:  alert & oriented X3.  Speech normal, gait appropriate for age and unassisted. DTR symmetric Romberg: Absent Psych--  Cognition and judgment appear intact.  Cooperative with normal attention span and concentration.  Behavior appropriate. No anxious or depressed appearing.      Assessment     Assessment  HTN Hyperlipidemia Thyroid  cancer 2011, right-sided papillary microcarcinoma, R thyroidectomy   Aphonia, from essential tremor?. Essential thrombocytopenia, JAK2 +   PLAN: Dizziness: As described above, on clinical grounds is hard to say if this is definitely a peripheral issue with certainty thus I recommend a CT head.  Patient is hesitant since she has similar symptoms with a negative CT head  07-2021.  Also at the end of the visit she said that last week she bumped her head, no LOC, no bleeding but she "saw stars" and wonders about a contusion. At the end of a long conversation she agreed to proceed with a CT.  If negative observation  but will reach out if the dizziness not resolved within few days. Chest pain: Single episode last week, at rest, atypical.. EKG today: No acute, at baseline. We agreed on observation, seek medical attention if symptoms resurface or severe. Hyperglycemia: Per chart review, check A1c Essential thrombocytopenia, JAK2 +: Saw hematology, diagnosed with thrombocytopenia, responded well to Hydrea . RTC 1 month

## 2023-11-13 NOTE — Patient Instructions (Signed)
 INSTRUCTIONS  FOR TODAY  Proceed with CT of the head.  If the dizziness is not gradually getting better let me know.  If you have severe dizziness: Seek medical attention immediately  If the chest pain resurface or is severe: Seek medical attention  GO TO THE LAB : Get the blood work     Next office visit for a checkup in 1 month Please make an appointment before you leave today

## 2023-11-14 LAB — HEMOGLOBIN A1C: Hgb A1c MFr Bld: 6 % (ref 4.6–6.5)

## 2023-11-14 NOTE — Assessment & Plan Note (Signed)
 Dizziness: As described above, on clinical grounds is hard to say if this is definitely a peripheral issue with certainty thus I recommend a CT head.  Patient is hesitant since she has similar symptoms with a negative CT head  07-2021.  Also at the end of the visit she said that last week she bumped her head, no LOC, no bleeding but she "saw stars" and wonders about a contusion. At the end of a long conversation she agreed to proceed with a CT.  If negative observation but will reach out if the dizziness not resolved within few days. Chest pain: Single episode last week, at rest, atypical.. EKG today: No acute, at baseline. We agreed on observation, seek medical attention if symptoms resurface or severe. Hyperglycemia: Per chart review, check A1c Essential thrombocytopenia, JAK2 +: Saw hematology, diagnosed with thrombocytopenia, responded well to Hydrea . RTC 1 month

## 2023-11-16 ENCOUNTER — Encounter: Payer: Self-pay | Admitting: Internal Medicine

## 2023-12-11 ENCOUNTER — Ambulatory Visit (INDEPENDENT_AMBULATORY_CARE_PROVIDER_SITE_OTHER): Admitting: Internal Medicine

## 2023-12-11 ENCOUNTER — Encounter: Payer: Self-pay | Admitting: Internal Medicine

## 2023-12-11 VITALS — BP 116/76 | HR 68 | Temp 98.2°F | Resp 16 | Ht 61.0 in | Wt 158.4 lb

## 2023-12-11 DIAGNOSIS — R079 Chest pain, unspecified: Secondary | ICD-10-CM

## 2023-12-11 DIAGNOSIS — R42 Dizziness and giddiness: Secondary | ICD-10-CM | POA: Diagnosis not present

## 2023-12-11 NOTE — Progress Notes (Unsigned)
 Subjective:    Patient ID: Virginia Cox, female    DOB: Dec 05, 1945, 78 y.o.   MRN: [id number]  DOS:  12/11/2023 Type of visit - description: Follow-up  Follow-up from last visit. Was seen with his symptoms, symptoms resolved within a few days. CTs negative.  Also had chest pain, no further symptoms.  Denies nausea or vomiting.  Normal gait.  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   atorvastatin  (LIPITOR) 40 mg, Oral, Daily at bedtime   co-enzyme Q-10 30 mg, Daily   fexofenadine (ALLEGRA) 180 mg, Daily   fluticasone (FLONASE) 50 MCG/ACT nasal spray 2 sprays, Daily   folic acid  (FOLVITE ) 1 mg, Oral, Daily   hydroxyurea  (HYDREA ) 500 mg, Oral, Daily, May take with food to minimize GI side effects.   potassium chloride  SA (KLOR-CON  M20) 20 MEQ tablet 20 mEq, Oral, Daily   triamterene -hydrochlorothiazide (MAXZIDE-25) 37.5-25 MG tablet 1 tablet, Oral, Daily       Objective:   Physical Exam BP 116/76   Pulse 68   Temp 98.2 F (36.8 C) (Oral)   Resp 16   Ht 5\' 1"  (1.549 m)   Wt 158 lb 6 oz (71.8 kg)   SpO2 95%   BMI 29.92 kg/m  General:   Well developed, NAD, BMI noted. HEENT:  Normocephalic . Face symmetric, atraumatic Lungs:  CTA B Normal respiratory effort, no intercostal retractions, no accessory muscle use. Heart: RRR,  no murmur.  Lower extremities: no pretibial edema bilaterally  Skin: Not pale. Not jaundice Neurologic:  alert & oriented X3.  Speech normal, gait appropriate for age and  unassisted Psych--  Cognition and judgment appear intact.  Cooperative with normal attention span and concentration.  Behavior appropriate. No anxious or depressed appearing.      Assessment      Assessment  HTN Hyperlipidemia Thyroid  cancer 2011, right-sided papillary microcarcinoma, R thyroidectomy   Aphonia, from essential tremor?. Essential thrombocytopenia, JAK2 +   PLAN: Dizziness: See LOV, CT head negative, symptoms resolved.  Patient wonders what the cause, possibly a printer issue.  Recommend observation. CP: Resolved.  No further symptoms Hyperglycemia: Last A1c 6.0  RTC CPX 02-2024   Dizziness: As described above, on clinical grounds is hard to say if this is definitely a peripheral issue with certainty thus I recommend a CT head.  Patient is hesitant since she has similar symptoms with a negative CT head  07-2021.  Also at the end of the visit she said that last week she bumped her head, no LOC, no bleeding but she "saw stars" and wonders about a contusion. At the end of a long conversation she agreed to proceed with a CT.  If negative observation but will reach out if the dizziness not resolved within few days. Chest pain: Single episode last week, at rest, atypical.. EKG today: No acute, at baseline. We agreed on observation, seek medical attention if symptoms  resurface or severe. Hyperglycemia: Per chart review, check A1c Essential thrombocytopenia, JAK2 +: Saw hematology, diagnosed with thrombocytopenia, responded well to Hydrea . RTC 1 month

## 2023-12-11 NOTE — Patient Instructions (Addendum)
 For you you have your urologist in 2 okay

## 2023-12-12 NOTE — Assessment & Plan Note (Signed)
 Dizziness: See LOV, CT head negative, symptoms resolved.  Patient wonders what the cause, possibly a inner ear issue.  Recommend observation. CP: Resolved.  No further symptoms Hyperglycemia: Last A1c 6.0 RTC CPX 02-2024

## 2024-01-01 ENCOUNTER — Encounter: Payer: Self-pay | Admitting: Family

## 2024-01-01 ENCOUNTER — Inpatient Hospital Stay (HOSPITAL_BASED_OUTPATIENT_CLINIC_OR_DEPARTMENT_OTHER): Admitting: Family

## 2024-01-01 ENCOUNTER — Other Ambulatory Visit: Payer: Self-pay | Admitting: *Deleted

## 2024-01-01 ENCOUNTER — Inpatient Hospital Stay: Attending: Hematology & Oncology

## 2024-01-01 VITALS — BP 129/64 | HR 63 | Temp 98.8°F | Resp 18 | Ht 61.0 in | Wt 155.1 lb

## 2024-01-01 DIAGNOSIS — D473 Essential (hemorrhagic) thrombocythemia: Secondary | ICD-10-CM | POA: Diagnosis not present

## 2024-01-01 DIAGNOSIS — D75839 Thrombocytosis, unspecified: Secondary | ICD-10-CM | POA: Insufficient documentation

## 2024-01-01 DIAGNOSIS — Z85850 Personal history of malignant neoplasm of thyroid: Secondary | ICD-10-CM | POA: Diagnosis not present

## 2024-01-01 LAB — CMP (CANCER CENTER ONLY)
ALT: 18 U/L (ref 0–44)
AST: 18 U/L (ref 15–41)
Albumin: 4.5 g/dL (ref 3.5–5.0)
Alkaline Phosphatase: 87 U/L (ref 38–126)
Anion gap: 9 (ref 5–15)
BUN: 22 mg/dL (ref 8–23)
CO2: 33 mmol/L — ABNORMAL HIGH (ref 22–32)
Calcium: 9.9 mg/dL (ref 8.9–10.3)
Chloride: 101 mmol/L (ref 98–111)
Creatinine: 1.04 mg/dL — ABNORMAL HIGH (ref 0.44–1.00)
GFR, Estimated: 56 mL/min — ABNORMAL LOW (ref >60)
Glucose, Bld: 108 mg/dL — ABNORMAL HIGH (ref 70–99)
Potassium: 3.7 mmol/L (ref 3.5–5.1)
Sodium: 143 mmol/L (ref 135–145)
Total Bilirubin: 0.6 mg/dL (ref 0.0–1.2)
Total Protein: 7.4 g/dL (ref 6.5–8.1)

## 2024-01-01 LAB — CBC WITH DIFFERENTIAL (CANCER CENTER ONLY)
Abs Immature Granulocytes: 0.04 10*3/uL (ref 0.00–0.07)
Basophils Absolute: 0 10*3/uL (ref 0.0–0.1)
Basophils Relative: 0 %
Eosinophils Absolute: 0.1 10*3/uL (ref 0.0–0.5)
Eosinophils Relative: 1 %
HCT: 40.1 % (ref 36.0–46.0)
Hemoglobin: 13.6 g/dL (ref 12.0–15.0)
Immature Granulocytes: 0 %
Lymphocytes Relative: 22 %
Lymphs Abs: 2.3 10*3/uL (ref 0.7–4.0)
MCH: 34.3 pg — ABNORMAL HIGH (ref 26.0–34.0)
MCHC: 33.9 g/dL (ref 30.0–36.0)
MCV: 101 fL — ABNORMAL HIGH (ref 80.0–100.0)
Monocytes Absolute: 0.7 10*3/uL (ref 0.1–1.0)
Monocytes Relative: 6 %
Neutro Abs: 7.4 10*3/uL (ref 1.7–7.7)
Neutrophils Relative %: 71 %
Platelet Count: 315 10*3/uL (ref 150–400)
RBC: 3.97 MIL/uL (ref 3.87–5.11)
RDW: 12.9 % (ref 11.5–15.5)
WBC Count: 10.4 10*3/uL (ref 4.0–10.5)
nRBC: 0 % (ref 0.0–0.2)

## 2024-01-01 LAB — FERRITIN: Ferritin: 70 ng/mL (ref 11–307)

## 2024-01-01 LAB — LACTATE DEHYDROGENASE: LDH: 168 U/L (ref 98–192)

## 2024-01-01 LAB — SAVE SMEAR(SSMR), FOR PROVIDER SLIDE REVIEW

## 2024-01-01 MED ORDER — FOLIC ACID 1 MG PO TABS: 1.0000 mg | ORAL_TABLET | Freq: Every day | ORAL | 6 refills | Status: DC

## 2024-01-01 NOTE — Progress Notes (Signed)
 Hematology and Oncology Follow Up Visit  Razan Siler [id number] 1945/10/11 78 y.o. 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 Soria is here today with her granddaughter for follow-up. She is doing quite well and appears to be tolerating the Hydrea  nicely.  Her counts have remained stable. Platelets are 315, Hgb 13.6, Hct 40% and WBC count 10.4.  No fatigue at this time.  She denies any fever, chills, n/v, cough, rash, dizziness, SOB, chest pain, palpitations, abdominal pain or changes in bowel or bladder habits.  No swelling, tenderness, numbness or tingling in her extremities.  No fall or syncope.  Appetite is good and she is going to try to better hydrate throughout the day. Weight is stable at 155 lbs.   ECOG Performance Status: 0 - Asymptomatic  Medications:  Allergies as of 01/01/2024   No Known Allergies      Medication List        Accurate as of January 01, 2024  3:05 PM. If you have any questions, ask your nurse or doctor.          amLODipine  5 MG tablet Commonly known as: NORVASC  Take 1 tablet (5 mg total) by mouth daily.   aspirin  EC 81 MG tablet Take 1 tablet (81 mg total) by mouth daily. Swallow whole.   atorvastatin  40 MG tablet Commonly known as: LIPITOR Take 1 tablet (40 mg total) by mouth at bedtime.   co-enzyme Q-10 30 MG capsule Take 30 mg by mouth daily.   fexofenadine 180 MG tablet Commonly known as: ALLEGRA Take 180 mg by mouth daily. OTC   fluticasone 50 MCG/ACT nasal spray Commonly known as: FLONASE Place 2 sprays into the nose daily.   folic acid  1 MG tablet Commonly known as: FOLVITE  Take 1 tablet (1 mg total) by mouth daily.   hydroxyurea  500 MG capsule Commonly known as:  HYDREA  Take 1 capsule (500 mg total) by mouth daily. May take with food to minimize GI side effects.   potassium chloride  SA 20 MEQ tablet Commonly known as: Klor-Con  M20 Take 1 tablet (20 mEq total) by mouth daily.   triamterene -hydrochlorothiazide 37.5-25 MG tablet Commonly known as: MAXZIDE-25 Take 1 tablet by mouth daily.        Allergies: No Known Allergies  Past Medical History, Surgical history, Social history, and Family History were reviewed and updated.  Review of Systems: All other 10 point review of systems is negative.   Physical Exam:  height is 5' 1 (1.549 m) and weight is 155 lb 1.9 oz (70.4 kg). Her oral temperature is 98.8 F (37.1 C). Her blood pressure is 129/64 and her pulse is 63. Her respiration is 18 and oxygen saturation is 98%.   Wt Readings from Last 3 Encounters:  01/01/24 155 lb 1.9 oz (70.4 kg)  12/11/23 158 lb 6 oz (71.8 kg)  11/13/23 155 lb 4 oz (70.4 kg)    Ocular: Sclerae unicteric, pupils equal, round and reactive to light Ear-nose-throat: Oropharynx clear, dentition fair Lymphatic: No cervical or supraclavicular adenopathy Lungs no rales or rhonchi, good excursion bilaterally Heart regular rate and rhythm, no murmur appreciated Abd soft, nontender, positive bowel sounds MSK no focal spinal  tenderness, no joint edema Neuro: non-focal, well-oriented, appropriate affect Breasts: Deferred   Lab Results  Component Value Date   WBC 10.4 01/01/2024   HGB 13.6 01/01/2024   HCT 40.1 01/01/2024   MCV 101.0 (H) 01/01/2024   PLT 315 01/01/2024   Lab Results  Component Value Date   FERRITIN 37 10/02/2023   IRON 63 10/02/2023   TIBC 335 10/02/2023   UIBC 272 10/02/2023   IRONPCTSAT 19 10/02/2023   Lab Results  Component Value Date   RBC 3.97 01/01/2024   No results found for: KPAFRELGTCHN, LAMBDASER, KAPLAMBRATIO No results found for: IGGSERUM, IGA, IGMSERUM No results found for: Hobson Luna, SPEI   Chemistry      Component Value Date/Time   NA 138 10/02/2023 1425   K 3.6 10/02/2023 1425   CL 98 10/02/2023 1425   CO2 30 10/02/2023 1425   BUN 18 10/02/2023 1425   CREATININE 0.99 10/02/2023 1425   CREATININE 1.08 (H) 02/23/2022 1414      Component Value Date/Time   CALCIUM  9.4 10/02/2023 1425   ALKPHOS 76 10/02/2023 1425   AST 21 10/02/2023 1425   ALT 18 10/02/2023 1425   BILITOT 0.6 10/02/2023 1425       Impression and Plan: Ms. Haddix is a 78 yo female with Essential Thrombocythemia.  She clearly has responded well to the Hydrea . Her counts today have remained stable.  No changes to her current dose of Hydrea .  Follow-up in another 3 months.   Kennard Pea, NP 6/18/20253:05 PM

## 2024-01-02 LAB — IRON AND IRON BINDING CAPACITY (CC-WL,HP ONLY)
Iron: 70 ug/dL (ref 28–170)
Saturation Ratios: 21 % (ref 10.4–31.8)
TIBC: 329 ug/dL (ref 250–450)
UIBC: 259 ug/dL (ref 148–442)

## 2024-02-13 ENCOUNTER — Other Ambulatory Visit: Payer: Self-pay | Admitting: Internal Medicine

## 2024-02-28 ENCOUNTER — Other Ambulatory Visit: Payer: Self-pay | Admitting: *Deleted

## 2024-02-28 ENCOUNTER — Encounter: Payer: Self-pay | Admitting: Internal Medicine

## 2024-02-28 ENCOUNTER — Ambulatory Visit (INDEPENDENT_AMBULATORY_CARE_PROVIDER_SITE_OTHER): Payer: Medicare Other | Admitting: Internal Medicine

## 2024-02-28 VITALS — BP 116/66 | HR 67 | Temp 98.1°F | Resp 16 | Ht 61.0 in | Wt 158.1 lb

## 2024-02-28 DIAGNOSIS — I10 Essential (primary) hypertension: Secondary | ICD-10-CM

## 2024-02-28 DIAGNOSIS — Z0000 Encounter for general adult medical examination without abnormal findings: Secondary | ICD-10-CM

## 2024-02-28 DIAGNOSIS — R7989 Other specified abnormal findings of blood chemistry: Secondary | ICD-10-CM | POA: Diagnosis not present

## 2024-02-28 DIAGNOSIS — E7849 Other hyperlipidemia: Secondary | ICD-10-CM

## 2024-02-28 DIAGNOSIS — Z5320 Procedure and treatment not carried out because of patient's decision for unspecified reasons: Secondary | ICD-10-CM | POA: Diagnosis not present

## 2024-02-28 DIAGNOSIS — Z2820 Immunization not carried out because of patient decision for unspecified reason: Secondary | ICD-10-CM | POA: Diagnosis not present

## 2024-02-28 DIAGNOSIS — R490 Dysphonia: Secondary | ICD-10-CM

## 2024-02-28 DIAGNOSIS — Z85850 Personal history of malignant neoplasm of thyroid: Secondary | ICD-10-CM

## 2024-02-28 DIAGNOSIS — D473 Essential (hemorrhagic) thrombocythemia: Secondary | ICD-10-CM

## 2024-02-28 MED ORDER — HYDROXYUREA 500 MG PO CAPS: 500.0000 mg | ORAL_CAPSULE | Freq: Every day | ORAL | 6 refills | Status: DC

## 2024-02-28 NOTE — Patient Instructions (Signed)
 Check the  blood pressure regularly Blood pressure goal:  between 110/65 and  135/85. If it is consistently higher or lower, let me know     GO TO THE LAB :  Get the blood work   Your results will be posted on MyChart with my comments  Next office visit for a physical exam in 1 year Please make an appointment before you leave today     Fall Prevention in the Home, Adult  Falls can cause injuries and affect people of all ages. There are many simple things that you can do to make your home safe and to help prevent falls. If you need it, ask for help making these changes. What actions can I take to prevent falls? General information Use good lighting in all rooms. Make sure to: Replace any light bulbs that burn out. Turn on lights if it is dark and use night-lights. Keep items that you use often in easy-to-reach places. Lower the shelves around your home if needed. Move furniture so that there are clear paths around it. Do not keep throw rugs or other things on the floor that can make you trip. If any of your floors are uneven, fix them. Add color or contrast paint or tape to clearly mark and help you see: Grab bars or handrails. First and last steps of staircases. Where the edge of each step is. If you use a ladder or stepladder: Make sure that it is fully opened. Do not climb a closed ladder. Make sure the sides of the ladder are locked in place. Have someone hold the ladder while you use it. Know where your pets are as you move through your home. What can I do in the bathroom?     Keep the floor dry. Clean up any water that is on the floor right away. Remove soap buildup in the bathtub or shower. Buildup makes bathtubs and showers slippery. Use non-skid mats or decals on the floor of the bathtub or shower. Attach bath mats securely with double-sided, non-slip rug tape. If you need to sit down while you are in the shower, use a non-slip stool. Install grab bars by the  toilet and in the bathtub and shower. Do not use towel bars as grab bars. What can I do in the bedroom? Make sure that you have a light by your bed that is easy to reach. Do not use any sheets or blankets on your bed that hang to the floor. Have a firm bench or chair with side arms that you can use for support when you get dressed. What can I do in the kitchen? Clean up any spills right away. If you need to reach something above you, use a sturdy step stool that has a grab bar. Keep electrical cables out of the way. Do not use floor polish or wax that makes floors slippery. What can I do with my stairs? Do not leave anything on the stairs. Make sure that you have a light switch at the top and the bottom of the stairs. Have them installed if you do not have them. Make sure that there are handrails on both sides of the stairs. Fix handrails that are broken or loose. Make sure that handrails are as long as the staircases. Install non-slip stair treads on all stairs in your home if they do not have carpet. Avoid having throw rugs at the top or bottom of stairs, or secure the rugs with carpet tape to prevent  them from moving. Choose a carpet design that does not hide the edge of steps on the stairs. Make sure that carpet is firmly attached to the stairs. Fix any carpet that is loose or worn. What can I do on the outside of my home? Use bright outdoor lighting. Repair the edges of walkways and driveways and fix any cracks. Clear paths of anything that can make you trip, such as tools or rocks. Add color or contrast paint or tape to clearly mark and help you see high doorway thresholds. Trim any bushes or trees on the main path into your home. Check that handrails are securely fastened and in good repair. Both sides of all steps should have handrails. Install guardrails along the edges of any raised decks or porches. Have leaves, snow, and ice cleared regularly. Use sand, salt, or ice melt on  walkways during winter months if you live where there is ice and snow. In the garage, clean up any spills right away, including grease or oil spills. What other actions can I take? Review your medicines with your health care provider. Some medicines can make you confused or feel dizzy. This can increase your chance of falling. Wear closed-toe shoes that fit well and support your feet. Wear shoes that have rubber soles and low heels. Use a cane, walker, scooter, or crutches that help you move around if needed. Talk with your provider about other ways that you can decrease your risk of falls. This may include seeing a physical therapist to learn to do exercises to improve movement and strength. Where to find more information Centers for Disease Control and Prevention, STEADI: TonerPromos.no [employer] on Aging: BaseRingTones.pl National Institute on Aging: BaseRingTones.pl Contact a health care provider if: You are afraid of falling at home. You feel weak, drowsy, or dizzy at home. You fall at home. Get help right away if you: Lose consciousness or have trouble moving after a fall. Have a fall that causes a head injury. These symptoms may be an emergency. Get help right away. Call 911. Do not wait to see if the symptoms will go away. Do not drive yourself to the hospital. This information is not intended to replace advice given to you by your health care provider. Make sure you discuss any questions you have with your health care provider. Document Revised: 03/05/2022 Document Reviewed: 03/05/2022 Elsevier Patient Education  2024 [employer].

## 2024-02-28 NOTE — Assessment & Plan Note (Signed)
 Here for CPX -Vaccines, screening for osteoporosis, cervical, colon and breast cancer: Has declined them consistently and again declines today. --Labs: Reviewed, will get FLP B12 folic acid -Lifestyle:  works at [employer], that keeps her active. - Reports no falls, nevertheless fall prevention discussed.

## 2024-02-28 NOTE — Assessment & Plan Note (Signed)
 Here for CPX   Other issues:  HTN: On amlodipine , potassium, Maxide.  BMP okay, last BMP satisfactory.  No change. Hyperlipidemia: On atorvastatin , no fasting, check a lipid profile, recheck if needed. Thrombocytopenia: Per hematology Increase MCV: Check a B12 and folic acid . RTC 1 year, declines to come back sooner.

## 2024-02-28 NOTE — Progress Notes (Signed)
 Subjective:    Patient ID: Virginia Cox, female    DOB: April 22, 1946, 78 y.o.   MRN: [id number]  DOS:  02/28/2024 Type of visit - description: CPX  Here for CPX. Feeling well. Has no concerns or new symptoms  Review of Systems See above   Past Medical History:  Diagnosis Date   Allergic rhinitis    Arthritis    Hyperlipidemia    Hypertension    Thyroid  cancer (HCC) 1-11    R papillary microcarcinoma    Past Surgical History:  Procedure Laterality Date   CESAREAN SECTION     x 1   THYROIDECTOMY  1/11      - papillary microcarcinoma   TUBAL LIGATION     Social History   Socioeconomic History   Marital status: Divorced    Spouse name: Not on file   Number of children: 3   Years of education: Not on file   Highest education level: Not on file  Occupational History   Occupation: retired  Midwife    Occupation: works at [employer] part -time  Tobacco Use   Smoking status: Never   Smokeless tobacco: Never  Vaping Use   Vaping status: Never Used  Substance and Sexual Activity   Alcohol use: No   Drug use: No   Sexual activity: Not on file  Other Topics Concern   Not on file  Social History Narrative   divorced , lives w/ Shelba midle daughter   3 kids , lost mother 09-2017, she was 33 y/o, lived w/ her mom all her life   1 daughter lives w/ her   1 daughter in [place]   1 daughter in [place]        Social Drivers of Health   Financial Resource Strain: Low Risk  (08/22/2023)   Overall Financial Resource Strain (CARDIA)    Difficulty of Paying Living Expenses: Not hard at all  Food Insecurity: No Food Insecurity (08/22/2023)   Hunger Vital Sign    Worried About Running Out of Food in the Last Year: Never true    Ran Out of Food in the Last Year: Never true  Transportation Needs: No Transportation Needs (08/22/2023)   PRAPARE - Administrator, Civil Service (Medical): No    Lack of Transportation (Non-Medical): No  Physical Activity: Insufficiently  Active (08/22/2023)   Exercise Vital Sign    Days of Exercise per Week: 2 days    Minutes of Exercise per Session: 30 min  Stress: No Stress Concern Present (08/22/2023)   [employer] of Occupational Health - Occupational Stress Questionnaire    Feeling of Stress : Not at all  Social Connections: Socially Isolated (08/22/2023)   Social Connection and Isolation Panel    Frequency of Communication with Friends and Family: More than three times a week    Frequency of Social Gatherings with Friends and Family: More than three times a week    Attends Religious Services: Never    Database administrator or Organizations: No    Attends Banker Meetings: Never    Marital Status: Widowed  Intimate Partner Violence: Not At Risk (08/22/2023)   Humiliation, Afraid, Rape, and Kick questionnaire    Fear of Current or Ex-Partner: No    Emotionally Abused: No    Physically Abused: No    Sexually Abused: No     Current Outpatient Medications  Medication Instructions   amLODipine  (NORVASC ) 5 mg, Oral, Daily   aspirin  EC 81  mg, Oral, Daily, Swallow whole.   atorvastatin  (LIPITOR) 40 mg, Oral, Daily at bedtime   co-enzyme Q-10 30 mg, Daily   fexofenadine (ALLEGRA) 180 mg, Daily   fluticasone (FLONASE) 50 MCG/ACT nasal spray 2 sprays, Daily   folic acid  (FOLVITE ) 1 mg, Oral, Daily   hydroxyurea  (HYDREA ) 500 mg, Oral, Daily, May take with food to minimize GI side effects.   potassium chloride  SA (KLOR-CON  M20) 20 MEQ tablet 20 mEq, Oral, Daily   triamterene -hydrochlorothiazide (MAXZIDE-25) 37.5-25 MG tablet 1 tablet, Oral, Daily       Objective:   Physical Exam BP 116/66   Pulse 67   Temp 98.1 F (36.7 C) (Oral)   Resp 16   Ht 5' 1 (1.549 m)   Wt 158 lb 2 oz (71.7 kg)   SpO2 97%   BMI 29.88 kg/m  General: Well developed, NAD, BMI noted Neck: No  thyromegaly  HEENT:  Normocephalic . Face symmetric, atraumatic.  Aphonia at baseline Lungs:  CTA B Normal respiratory effort,  no intercostal retractions, no accessory muscle use. Heart: RRR,  no murmur.  Abdomen:  Not distended, soft, non-tender. No rebound or rigidity.   Lower extremities: no pretibial edema bilaterally  Skin: Exposed areas without rash. Not pale. Not jaundice Neurologic:  alert & oriented X3.  Speech normal, gait appropriate for age and unassisted Strength symmetric and appropriate for age.  Psych: Cognition and judgment appear intact.  Cooperative with normal attention span and concentration.  Behavior appropriate. No anxious or depressed appearing.     Assessment     Assessment  HTN Hyperlipidemia Thyroid  cancer 2011, right-sided papillary microcarcinoma, R thyroidectomy   Aphonia, from essential tremor?. Essential thrombocytopenia, JAK2 +   PLAN: Here for CPX -Vaccines, screening for osteoporosis, cervical, colon and breast cancer: Has declined them consistently and again declines today. --Labs: Reviewed, will get FLP B12 folic acid  -Lifestyle:  works at [employer], that keeps her active. - Reports no falls, nevertheless fall prevention discussed.  Other issues:  HTN: On amlodipine , potassium, Maxide.  BMP okay, last BMP satisfactory.  No change. Hyperlipidemia: On atorvastatin , no fasting, check a lipid profile, recheck if needed. Thrombocytopenia: Per hematology Increase MCV: Check a B12 and folic acid . RTC 1 year, declines to come back sooner.

## 2024-02-29 LAB — LIPID PANEL
Cholesterol: 170 mg/dL (ref <200)
HDL: 75 mg/dL (ref >=50)
LDL Cholesterol (Calc): 80 mg/dL
Non-HDL Cholesterol (Calc): 95 mg/dL (ref <130)
Total CHOL/HDL Ratio: 2.3 (calc) (ref <5.0)
Triglycerides: 72 mg/dL (ref <150)

## 2024-02-29 LAB — B12 AND FOLATE PANEL
Folate: >24 ng/mL
Vitamin B-12: 591 pg/mL (ref 200–1100)

## 2024-03-01 ENCOUNTER — Ambulatory Visit: Payer: Self-pay | Admitting: Internal Medicine

## 2024-04-01 ENCOUNTER — Inpatient Hospital Stay (HOSPITAL_BASED_OUTPATIENT_CLINIC_OR_DEPARTMENT_OTHER): Admitting: Medical Oncology

## 2024-04-01 ENCOUNTER — Inpatient Hospital Stay: Attending: Family

## 2024-04-01 ENCOUNTER — Encounter: Payer: Self-pay | Admitting: Medical Oncology

## 2024-04-01 VITALS — BP 124/72 | HR 72 | Temp 98.3°F | Resp 17 | Wt 160.0 lb

## 2024-04-01 DIAGNOSIS — D473 Essential (hemorrhagic) thrombocythemia: Secondary | ICD-10-CM | POA: Diagnosis not present

## 2024-04-01 DIAGNOSIS — D75839 Thrombocytosis, unspecified: Secondary | ICD-10-CM | POA: Diagnosis not present

## 2024-04-01 DIAGNOSIS — Z85850 Personal history of malignant neoplasm of thyroid: Secondary | ICD-10-CM | POA: Insufficient documentation

## 2024-04-01 DIAGNOSIS — D509 Iron deficiency anemia, unspecified: Secondary | ICD-10-CM | POA: Diagnosis not present

## 2024-04-01 LAB — CBC WITH DIFFERENTIAL (CANCER CENTER ONLY)
Abs Immature Granulocytes: 0.02 K/uL (ref 0.00–0.07)
Basophils Absolute: 0 K/uL (ref 0.0–0.1)
Basophils Relative: 0 %
Eosinophils Absolute: 0.1 K/uL (ref 0.0–0.5)
Eosinophils Relative: 1 %
HCT: 41.2 % (ref 36.0–46.0)
Hemoglobin: 14.1 g/dL (ref 12.0–15.0)
Immature Granulocytes: 0 %
Lymphocytes Relative: 33 %
Lymphs Abs: 2.4 K/uL (ref 0.7–4.0)
MCH: 34.9 pg — ABNORMAL HIGH (ref 26.0–34.0)
MCHC: 34.2 g/dL (ref 30.0–36.0)
MCV: 102 fL — ABNORMAL HIGH (ref 80.0–100.0)
Monocytes Absolute: 0.5 K/uL (ref 0.1–1.0)
Monocytes Relative: 6 %
Neutro Abs: 4.3 K/uL (ref 1.7–7.7)
Neutrophils Relative %: 60 %
Platelet Count: 306 K/uL (ref 150–400)
RBC: 4.04 MIL/uL (ref 3.87–5.11)
RDW: 12.2 % (ref 11.5–15.5)
WBC Count: 7.3 K/uL (ref 4.0–10.5)
nRBC: 0 % (ref 0.0–0.2)

## 2024-04-01 LAB — CMP (CANCER CENTER ONLY)
ALT: 12 U/L (ref 0–44)
AST: 21 U/L (ref 15–41)
Albumin: 4.4 g/dL (ref 3.5–5.0)
Alkaline Phosphatase: 97 U/L (ref 38–126)
Anion gap: 10 (ref 5–15)
BUN: 14 mg/dL (ref 8–23)
CO2: 27 mmol/L (ref 22–32)
Calcium: 9.5 mg/dL (ref 8.9–10.3)
Chloride: 100 mmol/L (ref 98–111)
Creatinine: 0.96 mg/dL (ref 0.44–1.00)
GFR, Estimated: >60 mL/min (ref >60)
Glucose, Bld: 100 mg/dL — ABNORMAL HIGH (ref 70–99)
Potassium: 4.3 mmol/L (ref 3.5–5.1)
Sodium: 137 mmol/L (ref 135–145)
Total Bilirubin: 0.6 mg/dL (ref 0.0–1.2)
Total Protein: 7.5 g/dL (ref 6.5–8.1)

## 2024-04-01 LAB — LACTATE DEHYDROGENASE: LDH: 187 U/L (ref 98–192)

## 2024-04-01 NOTE — Progress Notes (Signed)
 Hematology and Oncology Follow Up Visit  Saron Vanorman [id number] 07-21-1945 78 y.o. 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 Aramburo is here today for follow up  They state that she has been well and continues to tolerate the hydrea  without side effects.  No reported fatigue at this time She denies any fever, chills, n/v, cough, rash, dizziness, SOB, chest pain, palpitations, abdominal pain or changes in bowel or bladder habits.  No swelling, tenderness, numbness or tingling in her extremities.  No fall or syncope Appetite is good  She declined being UTD on TBSE, breast and colon cancer screenings  Wt Readings from Last 3 Encounters:  04/01/24 160 lb (72.6 kg)  02/28/24 158 lb 2 oz (71.7 kg)  01/01/24 155 lb 1.9 oz (70.4 kg)     ECOG Performance Status: 0 - Asymptomatic  Medications:  Allergies as of 04/01/2024   No Known Allergies      Medication List        Accurate as of April 01, 2024  2:20 PM. If you have any questions, ask your nurse or doctor.          amLODipine  5 MG tablet Commonly known as: NORVASC  Take 1 tablet (5 mg total) by mouth daily.   aspirin  EC 81 MG tablet Take 1 tablet (81 mg total) by mouth daily. Swallow whole.   atorvastatin  40 MG tablet Commonly known as: LIPITOR Take 1 tablet (40 mg total) by mouth at bedtime.   co-enzyme Q-10 30 MG capsule Take 30 mg by mouth daily.   fexofenadine 180 MG tablet Commonly known as: ALLEGRA Take 180 mg by mouth daily. OTC   fluticasone 50 MCG/ACT nasal spray Commonly known as: FLONASE Place 2 sprays into the nose daily.   folic acid  1 MG tablet Commonly known as: FOLVITE  Take 1 tablet (1 mg total) by mouth daily.   hydroxyurea   500 MG capsule Commonly known as: HYDREA  Take 1 capsule (500 mg total) by mouth daily. May take with food to minimize GI side effects.   potassium chloride  SA 20 MEQ tablet Commonly known as: Klor-Con  M20 Take 1 tablet (20 mEq total) by mouth daily.   triamterene -hydrochlorothiazide 37.5-25 MG tablet Commonly known as: MAXZIDE-25 Take 1 tablet by mouth daily.        Allergies: No Known Allergies  Past Medical History, Surgical history, Social history, and Family History were reviewed and updated.  Review of Systems: All other 10 point review of systems is negative.   Physical Exam:  weight is 160 lb (72.6 kg). Her oral temperature is 98.3 F (36.8 C). Her blood pressure is 124/72 and her pulse is 72. Her respiration is 17 and oxygen saturation is 100%.   Wt Readings from Last 3 Encounters:  04/01/24 160 lb (72.6 kg)  02/28/24 158 lb 2 oz (71.7 kg)  01/01/24 155 lb 1.9 oz (70.4 kg)    Ocular: Sclerae unicteric, pupils equal, round and reactive to light Ear-nose-throat: Oropharynx clear, dentition fair Lymphatic: No cervical or supraclavicular adenopathy Lungs no rales or rhonchi, good excursion bilaterally Heart regular rate and rhythm, no murmur appreciated Abd soft, nontender, positive bowel sounds MSK no focal spinal tenderness, no  joint edema Neuro: non-focal, well-oriented, appropriate affect  Lab Results  Component Value Date   WBC 7.3 04/01/2024   HGB 14.1 04/01/2024   HCT 41.2 04/01/2024   MCV 102.0 (H) 04/01/2024   PLT 306 04/01/2024   Lab Results  Component Value Date   FERRITIN 70 01/01/2024   IRON 70 01/01/2024   TIBC 329 01/01/2024   UIBC 259 01/01/2024   IRONPCTSAT 21 01/01/2024   Lab Results  Component Value Date   RBC 4.04 04/01/2024   No results found for: KPAFRELGTCHN, LAMBDASER, KAPLAMBRATIO No results found for: IGGSERUM, IGA, IGMSERUM No results found for: STEPHANY CARLOTA BENSON MARKEL EARLA JOANNIE DOC VICK, SPEI   Chemistry      Component Value Date/Time   NA 143 01/01/2024 1432   K 3.7 01/01/2024 1432   CL 101 01/01/2024 1432   CO2 33 (H) 01/01/2024 1432   BUN 22 01/01/2024 1432   CREATININE 1.04 (H) 01/01/2024 1432   CREATININE 1.08 (H) 02/23/2022 1414      Component Value Date/Time   CALCIUM  9.9 01/01/2024 1432   ALKPHOS 87 01/01/2024 1432   AST 18 01/01/2024 1432   ALT 18 01/01/2024 1432   BILITOT 0.6 01/01/2024 1432     Encounter Diagnoses  Name Primary?   Essential thrombocythemia (HCC) Yes   Iron deficiency anemia, unspecified iron deficiency anemia type    Impression and Plan: Ms. Witman is a 78 yo female with Essential Thrombocythemia.  She clearly has responded well to the Hydrea . Her counts today have remained stable.   CBC shows no new anemia or WBC increase Platelets remain in target range with a value of 306 No changes to her current dose of Hydrea .  Per patient preference she would like to extend follow up out from 3 months to 6 months.   RTC 6 months APP/MD, labs (CBC, CMP, iron, ferritin, LDH)  Lauraine CHRISTELLA Dais, PA-C 9/17/20252:20 PM

## 2024-04-16 ENCOUNTER — Other Ambulatory Visit: Payer: Self-pay | Admitting: Internal Medicine

## 2024-05-12 ENCOUNTER — Other Ambulatory Visit: Payer: Self-pay | Admitting: Internal Medicine

## 2024-06-18 ENCOUNTER — Other Ambulatory Visit: Payer: Self-pay | Admitting: Family

## 2024-06-18 DIAGNOSIS — D473 Essential (hemorrhagic) thrombocythemia: Secondary | ICD-10-CM

## 2024-08-09 ENCOUNTER — Other Ambulatory Visit: Payer: Self-pay | Admitting: Internal Medicine

## 2024-08-09 ENCOUNTER — Other Ambulatory Visit: Payer: Self-pay | Admitting: Hematology & Oncology

## 2024-08-09 DIAGNOSIS — D473 Essential (hemorrhagic) thrombocythemia: Secondary | ICD-10-CM

## 2024-08-27 ENCOUNTER — Ambulatory Visit: Payer: Medicare Other

## 2024-09-30 ENCOUNTER — Ambulatory Visit: Admitting: Medical Oncology

## 2024-09-30 ENCOUNTER — Ambulatory Visit

## 2024-09-30 ENCOUNTER — Other Ambulatory Visit

## 2025-03-01 ENCOUNTER — Encounter: Admitting: Internal Medicine
